# Patient Record
Sex: Female | Born: 1975 | Race: Black or African American | Hispanic: No | Marital: Single | Smoking: Never smoker
Health system: Southern US, Community
[De-identification: ages and names within clinical notes are randomized; demographics above are authoritative.]

## PROBLEM LIST (undated history)

## (undated) ENCOUNTER — Emergency Department (HOSPITAL_COMMUNITY): Disposition: A | Discharge status: Home / Self Care | Payer: Self-pay

## (undated) DIAGNOSIS — D649 Anemia, unspecified: Secondary | ICD-10-CM

## (undated) DIAGNOSIS — R569 Unspecified convulsions: Secondary | ICD-10-CM

## (undated) DIAGNOSIS — K8020 Calculus of gallbladder without cholecystitis without obstruction: Secondary | ICD-10-CM

## (undated) DIAGNOSIS — F32A Depression, unspecified: Secondary | ICD-10-CM

## (undated) DIAGNOSIS — M1990 Unspecified osteoarthritis, unspecified site: Secondary | ICD-10-CM

## (undated) DIAGNOSIS — M797 Fibromyalgia: Secondary | ICD-10-CM

## (undated) DIAGNOSIS — I10 Essential (primary) hypertension: Secondary | ICD-10-CM

## (undated) DIAGNOSIS — F329 Major depressive disorder, single episode, unspecified: Secondary | ICD-10-CM

## (undated) HISTORY — PX: ADENOIDECTOMY: SUR15

## (undated) HISTORY — DX: Anemia, unspecified: D64.9

## (undated) HISTORY — PX: TONSILLECTOMY: SUR1361

## (undated) HISTORY — DX: Major depressive disorder, single episode, unspecified: F32.9

## (undated) HISTORY — PX: TYMPANOSTOMY TUBE PLACEMENT: SHX32

## (undated) HISTORY — PX: ABDOMINOPLASTY: SUR9

## (undated) HISTORY — DX: Essential (primary) hypertension: I10

## (undated) HISTORY — PX: TONSILECTOMY, ADENOIDECTOMY, BILATERAL MYRINGOTOMY AND TUBES: SHX2538

## (undated) HISTORY — DX: Depression, unspecified: F32.A

---
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
## 1998-09-28 ENCOUNTER — Other Ambulatory Visit: Admission: RE | Admit: 1998-09-28 | Discharge: 1998-09-28 | Payer: Self-pay | Admitting: Obstetrics and Gynecology

## 1999-12-05 HISTORY — PX: GASTRIC BYPASS: SHX52

## 2000-10-04 ENCOUNTER — Encounter (INDEPENDENT_AMBULATORY_CARE_PROVIDER_SITE_OTHER): Payer: Self-pay | Admitting: Specialist

## 2000-10-04 ENCOUNTER — Other Ambulatory Visit: Admission: RE | Admit: 2000-10-04 | Discharge: 2000-10-04 | Payer: Self-pay | Admitting: Otolaryngology

## 2001-02-27 ENCOUNTER — Other Ambulatory Visit: Admission: RE | Admit: 2001-02-27 | Discharge: 2001-02-27 | Payer: Self-pay | Admitting: Family Medicine

## 2002-08-28 ENCOUNTER — Other Ambulatory Visit: Admission: RE | Admit: 2002-08-28 | Discharge: 2002-08-28 | Payer: Self-pay | Admitting: Family Medicine

## 2003-10-02 ENCOUNTER — Other Ambulatory Visit: Admission: RE | Admit: 2003-10-02 | Discharge: 2003-10-02 | Payer: Self-pay | Admitting: Obstetrics and Gynecology

## 2008-04-06 ENCOUNTER — Ambulatory Visit (HOSPITAL_COMMUNITY): Admission: RE | Admit: 2008-04-06 | Discharge: 2008-04-06 | Payer: Self-pay | Admitting: Obstetrics and Gynecology

## 2011-03-29 ENCOUNTER — Ambulatory Visit: Payer: Self-pay | Admitting: Internal Medicine

## 2011-08-11 ENCOUNTER — Other Ambulatory Visit (HOSPITAL_COMMUNITY)
Admission: RE | Admit: 2011-08-11 | Discharge: 2011-08-11 | Disposition: A | Discharge status: Home / Self Care | Payer: BC Managed Care – PPO | Source: Ambulatory Visit | Attending: Obstetrics and Gynecology | Admitting: Obstetrics and Gynecology

## 2011-08-11 ENCOUNTER — Other Ambulatory Visit: Payer: Self-pay | Admitting: Obstetrics and Gynecology

## 2011-08-11 DIAGNOSIS — Z1159 Encounter for screening for other viral diseases: Secondary | ICD-10-CM | POA: Insufficient documentation

## 2011-08-11 DIAGNOSIS — Z01419 Encounter for gynecological examination (general) (routine) without abnormal findings: Secondary | ICD-10-CM | POA: Insufficient documentation

## 2012-07-22 ENCOUNTER — Other Ambulatory Visit: Payer: Self-pay | Admitting: Gastroenterology

## 2012-07-22 DIAGNOSIS — R1032 Left lower quadrant pain: Secondary | ICD-10-CM

## 2012-07-25 ENCOUNTER — Ambulatory Visit
Admission: RE | Admit: 2012-07-25 | Discharge: 2012-07-25 | Disposition: A | Discharge status: Home / Self Care | Payer: BC Managed Care – PPO | Source: Ambulatory Visit | Attending: Gastroenterology | Admitting: Gastroenterology

## 2012-07-25 DIAGNOSIS — R1032 Left lower quadrant pain: Secondary | ICD-10-CM

## 2012-07-25 MED ORDER — IOHEXOL 300 MG/ML  SOLN
125.0000 mL | Freq: Once | INTRAMUSCULAR | Status: AC | PRN
  Administered 2012-07-25: 125 mL via INTRAVENOUS

## 2012-09-03 ENCOUNTER — Other Ambulatory Visit (HOSPITAL_COMMUNITY)
Admission: RE | Admit: 2012-09-03 | Discharge: 2012-09-03 | Disposition: A | Discharge status: Home / Self Care | Payer: BC Managed Care – PPO | Source: Ambulatory Visit | Attending: Obstetrics and Gynecology | Admitting: Obstetrics and Gynecology

## 2012-09-03 ENCOUNTER — Other Ambulatory Visit: Payer: Self-pay | Admitting: Obstetrics and Gynecology

## 2012-09-03 DIAGNOSIS — Z113 Encounter for screening for infections with a predominantly sexual mode of transmission: Secondary | ICD-10-CM | POA: Insufficient documentation

## 2012-09-03 DIAGNOSIS — Z1151 Encounter for screening for human papillomavirus (HPV): Secondary | ICD-10-CM | POA: Insufficient documentation

## 2012-09-03 DIAGNOSIS — Z124 Encounter for screening for malignant neoplasm of cervix: Secondary | ICD-10-CM | POA: Insufficient documentation

## 2012-09-12 ENCOUNTER — Encounter (INDEPENDENT_AMBULATORY_CARE_PROVIDER_SITE_OTHER): Payer: Self-pay | Admitting: General Surgery

## 2012-09-19 ENCOUNTER — Ambulatory Visit (INDEPENDENT_AMBULATORY_CARE_PROVIDER_SITE_OTHER): Payer: BC Managed Care – PPO | Admitting: General Surgery

## 2012-09-19 ENCOUNTER — Encounter (INDEPENDENT_AMBULATORY_CARE_PROVIDER_SITE_OTHER): Payer: Self-pay | Admitting: General Surgery

## 2012-09-19 VITALS — BP 138/86 | HR 68 | Temp 98.5°F | Resp 16 | Ht 67.0 in | Wt 314.0 lb

## 2012-09-19 DIAGNOSIS — Z9884 Bariatric surgery status: Secondary | ICD-10-CM

## 2012-09-19 DIAGNOSIS — K8020 Calculus of gallbladder without cholecystitis without obstruction: Secondary | ICD-10-CM

## 2012-09-19 NOTE — Patient Instructions (Signed)
Plan for u/s and upper gi

## 2012-09-27 ENCOUNTER — Ambulatory Visit
Admission: RE | Admit: 2012-09-27 | Discharge: 2012-09-27 | Disposition: A | Discharge status: Home / Self Care | Payer: BC Managed Care – PPO | Source: Ambulatory Visit | Attending: General Surgery | Admitting: General Surgery

## 2012-09-27 DIAGNOSIS — K8020 Calculus of gallbladder without cholecystitis without obstruction: Secondary | ICD-10-CM

## 2012-09-27 DIAGNOSIS — Z9884 Bariatric surgery status: Secondary | ICD-10-CM

## 2012-09-30 ENCOUNTER — Other Ambulatory Visit (INDEPENDENT_AMBULATORY_CARE_PROVIDER_SITE_OTHER): Payer: Self-pay | Admitting: General Surgery

## 2012-10-01 ENCOUNTER — Ambulatory Visit (INDEPENDENT_AMBULATORY_CARE_PROVIDER_SITE_OTHER): Payer: BC Managed Care – PPO | Admitting: General Surgery

## 2012-10-01 ENCOUNTER — Encounter (INDEPENDENT_AMBULATORY_CARE_PROVIDER_SITE_OTHER): Payer: Self-pay | Admitting: General Surgery

## 2012-10-01 VITALS — BP 158/70 | HR 76 | Temp 97.1°F | Resp 16 | Ht 67.0 in | Wt 312.0 lb

## 2012-10-01 DIAGNOSIS — K8020 Calculus of gallbladder without cholecystitis without obstruction: Secondary | ICD-10-CM

## 2012-10-01 NOTE — Progress Notes (Signed)
Subjective:     Patient ID: Crystal Conner, female   DOB: 1976-07-22, 36 y.o.   MRN: [id number]  HPI The patient is a 36 year old white female who has been having some right upper quadrant pain. She has a history of a gastric bypass. Since her last visit we had her undergo an ultrasound which confirmed the presence of gallstones in the gallbladder. We also have her undergo an upper GI that showed no evidence of stricture. She is ready to schedule surgery now.  Review of Systems  Constitutional: Negative.   HENT: Negative.   Eyes: Negative.   Respiratory: Negative.   Cardiovascular: Negative.   Gastrointestinal: Positive for nausea.  Genitourinary: Negative.   Musculoskeletal: Negative.   Skin: Negative.   Neurological: Negative.   Hematological: Negative.   Psychiatric/Behavioral: Negative.        Objective:   Physical Exam  Constitutional: She is oriented to person, place, and time.       Morbidly obese black female  HENT:  Head: Normocephalic and atraumatic.  Eyes: Conjunctivae normal and EOM are normal. Pupils are equal, round, and reactive to light.  Neck: Normal range of motion. Neck supple.  Cardiovascular: Normal rate, regular rhythm and normal heart sounds.   Pulmonary/Chest: Effort normal and breath sounds normal.  Abdominal: Soft. Bowel sounds are normal. There is no tenderness.  Musculoskeletal: Normal range of motion.  Neurological: She is alert and oriented to person, place, and time.  Skin: Skin is warm and dry.  Psychiatric: She has a normal mood and affect. Her behavior is normal.       Assessment:     The patient appears to have symptomatic gallstones. Because of the risks of pancreatitis and further painful episodes of think she would benefit from having her gallbladder removed. I've discussed with her in detail the risks and benefits of the operation to remove the gallbladder as well as some of the technical aspects and she understands and wishes to  proceed.    Plan:     Plan for laparoscopic cholecystectomy with intraoperative cholangiogram

## 2012-10-01 NOTE — Patient Instructions (Signed)
Plan for laparoscopic cholecystectomy with intraoperative cholangiogram 

## 2012-10-16 NOTE — Progress Notes (Signed)
Subjective:     Patient ID: Crystal Conner, female   DOB: 05/09/1976, 36 y.o.   MRN: [id number]  HPI We are asked to see the patient in consultation by Dr. Green to evaluate her for her gallstones. The patient is a 36-year-old black female who over the last month or so has been experiencing some nausea with eating. She is also had a couple episodes of vomiting. She denies much in the way of abdominal pain until just recently when she had an episode of right upper quadrant pain. She does have a history of a gastric bypass done in another town. She also recently had a abdominal ultrasound at her gynecologist office that questioned whether she had 2 gallstones. She also had a CT scan which questioned whether she could have a stricture from her previous gastric bypass. She has not been losing any weight.  Review of Systems  Constitutional: Negative.   HENT: Negative.   Eyes: Negative.   Respiratory: Negative.   Cardiovascular: Negative.   Gastrointestinal: Positive for nausea, vomiting and abdominal pain.  Genitourinary: Negative.   Musculoskeletal: Negative.   Skin: Negative.   Neurological: Negative.   Hematological: Negative.   Psychiatric/Behavioral: Negative.        Objective:   Physical Exam  Constitutional: She is oriented to person, place, and time. She appears well-developed and well-nourished.  HENT:  Head: Normocephalic and atraumatic.  Eyes: Conjunctivae normal and EOM are normal. Pupils are equal, round, and reactive to light.  Neck: Normal range of motion. Neck supple.  Cardiovascular: Normal rate, regular rhythm and normal heart sounds.   Pulmonary/Chest: Effort normal and breath sounds normal.  Abdominal: Soft. Bowel sounds are normal. She exhibits no mass. There is no tenderness.  Musculoskeletal: Normal range of motion.  Neurological: She is alert and oriented to person, place, and time.  Skin: Skin is warm and dry.  Psychiatric: She has a normal mood and affect.  Her behavior is normal.       Assessment:     The patient has a symptoms that are consistent with biliary colic. We will repeat her abdominal ultrasound so that our radiologist can confirm that she has gallstones. If she does not think she would be a candidate for a laparoscopic cholecystectomy. I have discussed with her in detail the risks and benefits of the operation to remove the gallbladder as well as some of the technical aspects and she understands and wishes to proceed. This can be complicated by the fact that she's had a previous gastric bypass. We will also obtain an upper GI with small bowel follow-through to make sure that she has no stricture associated with her previous surgery    Plan:     Plan for laparoscopic cholecystectomy with intraoperative cholangiogram if her studies show what we expect him to show      

## 2012-10-22 ENCOUNTER — Encounter (HOSPITAL_COMMUNITY): Payer: Self-pay | Admitting: Pharmacy Technician

## 2012-10-23 ENCOUNTER — Encounter (HOSPITAL_COMMUNITY)
Admission: RE | Admit: 2012-10-23 | Discharge: 2012-10-23 | Disposition: A | Discharge status: Home / Self Care | Payer: BC Managed Care – PPO | Source: Ambulatory Visit | Attending: General Surgery | Admitting: General Surgery

## 2012-10-23 ENCOUNTER — Encounter (HOSPITAL_COMMUNITY): Payer: Self-pay

## 2012-10-23 ENCOUNTER — Ambulatory Visit (HOSPITAL_COMMUNITY)
Admission: RE | Admit: 2012-10-23 | Discharge: 2012-10-23 | Disposition: A | Discharge status: Home / Self Care | Payer: BC Managed Care – PPO | Source: Ambulatory Visit | Attending: General Surgery | Admitting: General Surgery

## 2012-10-23 DIAGNOSIS — Z01812 Encounter for preprocedural laboratory examination: Secondary | ICD-10-CM | POA: Insufficient documentation

## 2012-10-23 DIAGNOSIS — J8410 Pulmonary fibrosis, unspecified: Secondary | ICD-10-CM | POA: Insufficient documentation

## 2012-10-23 DIAGNOSIS — K8020 Calculus of gallbladder without cholecystitis without obstruction: Secondary | ICD-10-CM | POA: Insufficient documentation

## 2012-10-23 DIAGNOSIS — I10 Essential (primary) hypertension: Secondary | ICD-10-CM | POA: Insufficient documentation

## 2012-10-23 DIAGNOSIS — M4120 Other idiopathic scoliosis, site unspecified: Secondary | ICD-10-CM | POA: Insufficient documentation

## 2012-10-23 HISTORY — DX: Unspecified osteoarthritis, unspecified site: M19.90

## 2012-10-23 HISTORY — DX: Calculus of gallbladder without cholecystitis without obstruction: K80.20

## 2012-10-23 LAB — CBC
Hemoglobin: 11.6 g/dL — ABNORMAL LOW (ref 12.0–15.0)
MCH: 28.6 pg (ref 26.0–34.0)
MCHC: 34 g/dL (ref 30.0–36.0)

## 2012-10-23 LAB — BASIC METABOLIC PANEL
BUN: 12 mg/dL (ref 6–23)
Calcium: 9.5 mg/dL (ref 8.4–10.5)
GFR calc non Af Amer: 90 mL/min — ABNORMAL LOW (ref >90)
Glucose, Bld: 84 mg/dL (ref 70–99)
Sodium: 140 mEq/L (ref 135–145)

## 2012-10-23 LAB — SURGICAL PCR SCREEN: MRSA, PCR: NEGATIVE

## 2012-10-23 NOTE — Patient Instructions (Signed)
Crystal Conner  10/23/2012                           YOUR PROCEDURE IS SCHEDULED ON:  10/30/12 AT 10:30 AM               PLEASE REPORT TO SHORT STAY CENTER AT :  8:00 AM               CALL THIS NUMBER IF ANY PROBLEMS THE DAY OF SURGERY :               [phone]                      REMEMBER:   Do not eat food or drink liquids AFTER MIDNIGHT    Take these medicines the morning of surgery with A SIP OF WATER:  ZYRTEC / BIRTH CONTROL PILL / PROVERA   Do not wear jewelry, make-up   Do not wear lotions, powders, or perfumes.   Do not shave legs or underarms 12 hrs. before surgery (men may shave face)  Do not bring valuables to the hospital.  Contacts, dentures or bridgework may not be worn into surgery.  Leave suitcase in the car. After surgery it may be brought to your room.  For patients admitted to the hospital more than one night, checkout time is 11:00                          The day of discharge.   Patients discharged the day of surgery will not be allowed to drive home                             If going home same day of surgery, must have someone stay with you first                           24 hrs at home and arrange for some one to drive you home from hospital.    Special Instructions:   Please read over the following fact sheets that you were given:               1. MRSA  INFORMATION                      2. [place] PREPARING FOR SURGERY SHEET                                                X_____________________________________________________________________

## 2012-10-30 ENCOUNTER — Encounter (HOSPITAL_COMMUNITY)
Admission: RE | Disposition: A | Discharge status: Home / Self Care | Payer: Self-pay | Source: Ambulatory Visit | Attending: General Surgery

## 2012-10-30 ENCOUNTER — Ambulatory Visit (HOSPITAL_COMMUNITY): Payer: BC Managed Care – PPO

## 2012-10-30 ENCOUNTER — Encounter (HOSPITAL_COMMUNITY): Payer: Self-pay | Admitting: *Deleted

## 2012-10-30 ENCOUNTER — Encounter (HOSPITAL_COMMUNITY): Payer: Self-pay | Admitting: Anesthesiology

## 2012-10-30 ENCOUNTER — Ambulatory Visit (HOSPITAL_COMMUNITY): Payer: BC Managed Care – PPO | Admitting: Anesthesiology

## 2012-10-30 ENCOUNTER — Ambulatory Visit (HOSPITAL_COMMUNITY)
Admission: RE | Admit: 2012-10-30 | Discharge: 2012-10-30 | Disposition: A | Discharge status: Home / Self Care | Payer: BC Managed Care – PPO | Source: Ambulatory Visit | Attending: General Surgery | Admitting: General Surgery

## 2012-10-30 DIAGNOSIS — Z9884 Bariatric surgery status: Secondary | ICD-10-CM | POA: Insufficient documentation

## 2012-10-30 DIAGNOSIS — K8010 Calculus of gallbladder with chronic cholecystitis without obstruction: Secondary | ICD-10-CM | POA: Insufficient documentation

## 2012-10-30 DIAGNOSIS — I10 Essential (primary) hypertension: Secondary | ICD-10-CM | POA: Insufficient documentation

## 2012-10-30 DIAGNOSIS — K8020 Calculus of gallbladder without cholecystitis without obstruction: Secondary | ICD-10-CM

## 2012-10-30 HISTORY — PX: CHOLECYSTECTOMY: SHX55

## 2012-10-30 SURGERY — LAPAROSCOPIC CHOLECYSTECTOMY WITH INTRAOPERATIVE CHOLANGIOGRAM
Anesthesia: General | Site: Abdomen | Wound class: Clean Contaminated

## 2012-10-30 MED ORDER — NEOSTIGMINE METHYLSULFATE 1 MG/ML IJ SOLN
INTRAMUSCULAR | Status: DC | PRN
  Administered 2012-10-30: 5 mg via INTRAVENOUS

## 2012-10-30 MED ORDER — CIPROFLOXACIN IN D5W 400 MG/200ML IV SOLN
INTRAVENOUS | Status: AC
  Filled 2012-10-30: qty 200

## 2012-10-30 MED ORDER — LACTATED RINGERS IV SOLN
INTRAVENOUS | Status: DC | PRN
  Administered 2012-10-30: 1000 mL via INTRAVENOUS

## 2012-10-30 MED ORDER — PROMETHAZINE HCL 25 MG/ML IJ SOLN
6.2500 mg | INTRAMUSCULAR | Status: DC | PRN
  Administered 2012-10-30: 6.25 mg via INTRAVENOUS
  Filled 2012-10-30: qty 1

## 2012-10-30 MED ORDER — HYDROMORPHONE HCL PF 1 MG/ML IJ SOLN
INTRAMUSCULAR | Status: DC | PRN
  Administered 2012-10-30: 0.5 mg via INTRAVENOUS
  Administered 2012-10-30 (×2): 1 mg via INTRAVENOUS

## 2012-10-30 MED ORDER — ROCURONIUM BROMIDE 100 MG/10ML IV SOLN
INTRAVENOUS | Status: DC | PRN
  Administered 2012-10-30: 10 mg via INTRAVENOUS
  Administered 2012-10-30: 30 mg via INTRAVENOUS

## 2012-10-30 MED ORDER — IOHEXOL 300 MG/ML  SOLN
INTRAMUSCULAR | Status: AC
  Filled 2012-10-30: qty 1

## 2012-10-30 MED ORDER — CHLORHEXIDINE GLUCONATE 4 % EX LIQD
1.0000 "application " | Freq: Once | CUTANEOUS | Status: DC
  Filled 2012-10-30: qty 15

## 2012-10-30 MED ORDER — LACTATED RINGERS IV SOLN: INTRAVENOUS | Status: DC

## 2012-10-30 MED ORDER — BUPIVACAINE-EPINEPHRINE 0.25% -1:200000 IJ SOLN
INTRAMUSCULAR | Status: DC | PRN
  Administered 2012-10-30: 17 mL

## 2012-10-30 MED ORDER — CIPROFLOXACIN IN D5W 400 MG/200ML IV SOLN
400.0000 mg | INTRAVENOUS | Status: AC
  Administered 2012-10-30: 400 mg via INTRAVENOUS

## 2012-10-30 MED ORDER — MIDAZOLAM HCL 5 MG/5ML IJ SOLN
INTRAMUSCULAR | Status: DC | PRN
  Administered 2012-10-30: 2 mg via INTRAVENOUS

## 2012-10-30 MED ORDER — LACTATED RINGERS IV SOLN
INTRAVENOUS | Status: DC | PRN
  Administered 2012-10-30 (×2): via INTRAVENOUS

## 2012-10-30 MED ORDER — AMLODIPINE BESYLATE 5 MG PO TABS
5.0000 mg | ORAL_TABLET | Freq: Once | ORAL | Status: AC
  Administered 2012-10-30: 5 mg via ORAL
  Filled 2012-10-30: qty 1

## 2012-10-30 MED ORDER — ONDANSETRON HCL 4 MG/2ML IJ SOLN
INTRAMUSCULAR | Status: DC | PRN
  Administered 2012-10-30: 4 mg via INTRAVENOUS

## 2012-10-30 MED ORDER — HYDROCODONE-ACETAMINOPHEN 5-325 MG PO TABS: 1.0000 | ORAL_TABLET | Freq: Four times a day (QID) | ORAL | Status: DC | PRN

## 2012-10-30 MED ORDER — PROPOFOL 10 MG/ML IV BOLUS
INTRAVENOUS | Status: DC | PRN
  Administered 2012-10-30: 200 mg via INTRAVENOUS

## 2012-10-30 MED ORDER — IOHEXOL 300 MG/ML  SOLN
INTRAMUSCULAR | Status: DC | PRN
  Administered 2012-10-30: 17 mL

## 2012-10-30 MED ORDER — SUCCINYLCHOLINE CHLORIDE 20 MG/ML IJ SOLN
INTRAMUSCULAR | Status: DC | PRN
  Administered 2012-10-30: 100 mg via INTRAVENOUS

## 2012-10-30 MED ORDER — LIDOCAINE HCL (CARDIAC) 20 MG/ML IV SOLN
INTRAVENOUS | Status: DC | PRN
  Administered 2012-10-30: 50 mg via INTRAVENOUS

## 2012-10-30 MED ORDER — BUPIVACAINE-EPINEPHRINE 0.25% -1:200000 IJ SOLN
INTRAMUSCULAR | Status: AC
  Filled 2012-10-30: qty 1

## 2012-10-30 MED ORDER — GLYCOPYRROLATE 0.2 MG/ML IJ SOLN
INTRAMUSCULAR | Status: DC | PRN
  Administered 2012-10-30: .8 mg via INTRAVENOUS

## 2012-10-30 MED ORDER — HYDROMORPHONE HCL PF 1 MG/ML IJ SOLN: 0.2500 mg | INTRAMUSCULAR | Status: DC | PRN

## 2012-10-30 MED ORDER — DEXAMETHASONE SODIUM PHOSPHATE 10 MG/ML IJ SOLN
INTRAMUSCULAR | Status: DC | PRN
  Administered 2012-10-30: 10 mg via INTRAVENOUS

## 2012-10-30 MED ORDER — FENTANYL CITRATE 0.05 MG/ML IJ SOLN
INTRAMUSCULAR | Status: DC | PRN
  Administered 2012-10-30: 100 ug via INTRAVENOUS
  Administered 2012-10-30: 50 ug via INTRAVENOUS
  Administered 2012-10-30: 100 ug via INTRAVENOUS

## 2012-10-30 SURGICAL SUPPLY — 38 items
ADH SKN CLS APL DERMABOND .7 (GAUZE/BANDAGES/DRESSINGS) ×1
APPLIER CLIP ROT 10 11.4 M/L (STAPLE) ×2
APR CLP MED LRG 11.4X10 (STAPLE) ×1
BAG SPEC RTRVL LRG 6X4 10 (ENDOMECHANICALS)
CANISTER SUCTION 2500CC (MISCELLANEOUS) ×2 IMPLANT
CATH REDDICK CHOLANGI 4FR 50CM (CATHETERS) ×2 IMPLANT
CLIP APPLIE ROT 10 11.4 M/L (STAPLE) ×1 IMPLANT
CLOTH BEACON ORANGE TIMEOUT ST (SAFETY) ×2 IMPLANT
COVER MAYO STAND STRL (DRAPES) ×2 IMPLANT
DECANTER SPIKE VIAL GLASS SM (MISCELLANEOUS) ×2 IMPLANT
DERMABOND ADVANCED (GAUZE/BANDAGES/DRESSINGS) ×1
DERMABOND ADVANCED .7 DNX12 (GAUZE/BANDAGES/DRESSINGS) ×1 IMPLANT
DRAPE C-ARM 42X72 X-RAY (DRAPES) ×2 IMPLANT
DRAPE LAPAROSCOPIC ABDOMINAL (DRAPES) ×2 IMPLANT
DRAPE UTILITY XL STRL (DRAPES) ×2 IMPLANT
ELECT REM PT RETURN 9FT ADLT (ELECTROSURGICAL) ×2
ELECTRODE REM PT RTRN 9FT ADLT (ELECTROSURGICAL) ×1 IMPLANT
GLOVE BIO SURGEON STRL SZ7.5 (GLOVE) ×4 IMPLANT
GLOVE BIOGEL PI IND STRL 7.0 (GLOVE) ×1 IMPLANT
GLOVE BIOGEL PI INDICATOR 7.0 (GLOVE) ×1
GOWN PREVENTION PLUS XLARGE (GOWN DISPOSABLE) ×2 IMPLANT
GOWN STRL NON-REIN LRG LVL3 (GOWN DISPOSABLE) ×2 IMPLANT
GOWN STRL REIN XL XLG (GOWN DISPOSABLE) ×2 IMPLANT
HEMOSTAT SURGICEL 4X8 (HEMOSTASIS) ×2 IMPLANT
HOVERMATT SINGLE USE (MISCELLANEOUS) ×1 IMPLANT
IV CATH 14GX2 1/4 (CATHETERS) ×2 IMPLANT
KIT BASIN OR (CUSTOM PROCEDURE TRAY) ×2 IMPLANT
POUCH SPECIMEN RETRIEVAL 10MM (ENDOMECHANICALS) IMPLANT
SET IRRIG TUBING LAPAROSCOPIC (IRRIGATION / IRRIGATOR) ×2 IMPLANT
SOLUTION ANTI FOG 6CC (MISCELLANEOUS) ×2 IMPLANT
SUT MNCRL AB 4-0 PS2 18 (SUTURE) ×2 IMPLANT
TOWEL OR 17X26 10 PK STRL BLUE (TOWEL DISPOSABLE) ×6 IMPLANT
TRAY LAP CHOLE (CUSTOM PROCEDURE TRAY) ×2 IMPLANT
TROCAR BLADELESS OPT 5 75 (ENDOMECHANICALS) ×4 IMPLANT
TROCAR XCEL BLUNT TIP 100MML (ENDOMECHANICALS) ×2 IMPLANT
TROCAR XCEL NON-BLD 11X100MML (ENDOMECHANICALS) ×2 IMPLANT
TROCAR Z-THREAD FIOS 5X100MM (TROCAR) ×1 IMPLANT
TUBING INSUFFLATION 10FT LAP (TUBING) ×2 IMPLANT

## 2012-10-30 NOTE — Anesthesia Preprocedure Evaluation (Signed)
Anesthesia Evaluation  Patient identified by MRN, date of birth, ID band Patient awake    Reviewed: Allergy & Precautions, H&P , NPO status , Patient's Chart, lab work & pertinent test results, reviewed documented beta blocker date and time   Airway Mallampati: II TM Distance: >3 FB Neck ROM: full    Dental No notable dental hx.    Pulmonary neg pulmonary ROS,  breath sounds clear to auscultation  Pulmonary exam normal       Cardiovascular Exercise Tolerance: Good hypertension, On Medications Rhythm:regular Rate:Normal     Neuro/Psych negative neurological ROS  negative psych ROS   GI/Hepatic negative GI ROS, Neg liver ROS,   Endo/Other  Morbid obesity  Renal/GU negative Renal ROS  negative genitourinary   Musculoskeletal   Abdominal   Peds  Hematology negative hematology ROS (+)   Anesthesia Other Findings   Reproductive/Obstetrics Negative pregnancy test.                           Anesthesia Physical Anesthesia Plan  ASA: III  Anesthesia Plan: General ETT   Post-op Pain Management:    Induction:   Airway Management Planned:   Additional Equipment:   Intra-op Plan:   Post-operative Plan:   Informed Consent: I have reviewed the patients History and Physical, chart, labs and discussed the procedure including the risks, benefits and alternatives for the proposed anesthesia with the patient or authorized representative who has indicated his/her understanding and acceptance.   Dental Advisory Given  Plan Discussed with: CRNA  Anesthesia Plan Comments:         Anesthesia Quick Evaluation

## 2012-10-30 NOTE — Op Note (Signed)
10/30/2012  11:22 AM  PATIENT:  Crystal Conner  36 y.o. female  PRE-OPERATIVE DIAGNOSIS:  gallstones  POST-OPERATIVE DIAGNOSIS:  gallstones  PROCEDURE:  Procedure(s) (LRB) with comments: LAPAROSCOPIC CHOLECYSTECTOMY WITH INTRAOPERATIVE CHOLANGIOGRAM (N/A)  SURGEON:  Surgeon(s) and Role:    * Robyne Askew, MD - Primary    * Valarie Merino, MD - Assisting  PHYSICIAN ASSISTANT:   ASSISTANTS: none   ANESTHESIA:   general  EBL:  Total I/O In: 1000 [I.V.:1000] Out: -   BLOOD ADMINISTERED:none  DRAINS: none   LOCAL MEDICATIONS USED:  MARCAINE     SPECIMEN:  Source of Specimen:  gallbladder  DISPOSITION OF SPECIMEN:  PATHOLOGY  COUNTS:  YES  TOURNIQUET:  * No tourniquets in log *  DICTATION: .Dragon Dictation @opnoteheader @  Procedure: After informed consent was obtained the patient was brought to the operating room and placed in the supine position on the operating room table. After adequate induction of general anesthesia the patient's abdomen was prepped with ChloraPrep allowed to dry and draped in usual sterile manner.An area in the right upper quadrant was infiltrated with 0.25%marcaine. A small incision was made with a 15 blade knife. A 5 mm Optiview port with the camera was used to dissect bluntly through the layers of the abdominal wall until access was gained to the abdominal cavity. The abdomen was then insufflated with carbon dioxide without difficulty.. Next the epigastric region was infiltrated with % Marcaine. A small incision was made with a 15 blade knife. A 10 mm port was placed bluntly through this incision into the abdominal cavity under direct vision.another site was chosen laterally on the right side of the abdomen for placement of a 5 mm port. A small area area was infiltrated with quarter percent Marcaine. A Small stab incision was made with a 15 blade knife. 5 mm port was then placed bluntly through this incision into the abdominal cavity under  direct vision without difficulty. Another site was chosen just lateral to the umbilicus for placement of another 5 mm port. This area was infiltrated with quarter percent Marcaine. A small stab incision was made with a 15 blade knife. A 5 mm port was placed bluntly through this incision into the abdominal cavity under direct vision. The camera was then moved to this port. A blunt grasper was placed through the lateralmost 5 mm port and used to grasp the dome of the gallbladder and elevated anteriorly and superiorly. Another blunt grasper was placed through the other 5 mm port and used to retract the body and neck of the gallbladder. A dissector was placed through the epigastric port and using the electrocautery the peritoneal reflection at the gallbladder neck was opened. Blunt dissection was then carried out in this area until the gallbladder neck-cystic duct junction was readily identified and a good window was created. A single clip was placed on the gallbladder neck. A small  ductotomy was made just below the clip with laparoscopic scissors. A 14-gauge Angiocath was then placed through the anterior abdominal wall under direct vision. A Reddick cholangiogram catheter was then placed through the Angiocath and flushed. The catheter was then placed in the cystic duct and anchored in place with a clip. A cholangiogram was obtained that showed no filling defects good emptying into the duodenum an adequate length on the cystic duct. The anchoring clip and catheters were then removed from the patient. 3 clips were placed proximally on the cystic duct and the duct was divided between  the 2 sets of clips. Posterior to this the cystic artery was identified and again dissected bluntly in a circumferential manner until a good window  was created. 2 clips were placed proximally and one distally on the artery and the artery was divided between the 2 sets of clips. Next a laparoscopic hook cautery device was used to separate  the gallbladder from the liver bed. Prior to completely detaching the gallbladder from the liver bed the liver bed was inspected and several small bleeding points were coagulated with the electrocautery until the area was completely hemostatic. The gallbladder was then detached the rest of it from the liver bed without difficulty. A laparoscopic bag was inserted through the epigastric port. The gallbladder was placed within the bag and the bag was sealed.the gallbladder and bag were removed through the epigastric port without difficulty.The liver bed was inspected again and found to be hemostatic. The abdomen was irrigated with copious amounts of saline until the effluent was clear. The ports were then removed under direct vision without difficulty and were found to be hemostatic. The gas was allowed to escape. The skin incisions were all closed with interrupted 4-0 Monocryl subcuticular stitches. Dermabond dressings were applied. The patient tolerated the procedure well. At the end of the case all needle sponge and instrument counts were correct. The patient was then awakened and taken to recovery in stable condition   PLAN OF CARE: Discharge to home after PACU  PATIENT DISPOSITION:  PACU - hemodynamically stable.   Delay start of Pharmacological VTE agent (>24hrs) due to surgical blood loss or risk of bleeding: no

## 2012-10-30 NOTE — Progress Notes (Signed)
Patient very sleepy hardly able to open eyes. IV restarted and Phenergan given for N/V and sleeping at short intervals.

## 2012-10-30 NOTE — Progress Notes (Signed)
Patient is more alert after gallbladder surgery. Up to bathroom and tolerated well. Nausea is improved.

## 2012-10-30 NOTE — Interval H&P Note (Signed)
History and Physical Interval Note:  10/30/2012 9:06 AM  Crystal Conner  has presented today for surgery, with the diagnosis of gallstones  The various methods of treatment have been discussed with the patient and family. After consideration of risks, benefits and other options for treatment, the patient has consented to  Procedure(s) (LRB) with comments: LAPAROSCOPIC CHOLECYSTECTOMY WITH INTRAOPERATIVE CHOLANGIOGRAM (N/A) as a surgical intervention .  The patient's history has been reviewed, patient examined, no change in status, stable for surgery.  I have reviewed the patient's chart and labs.  Questions were answered to the patient's satisfaction.     TOTH III,PAUL S

## 2012-10-30 NOTE — Transfer of Care (Signed)
Immediate Anesthesia Transfer of Care Note  Patient: Crystal Conner  Procedure(s) Performed: Procedure(s) (LRB): LAPAROSCOPIC CHOLECYSTECTOMY WITH INTRAOPERATIVE CHOLANGIOGRAM (N/A)  Patient Location: PACU  Anesthesia Type: General  Level of Consciousness: sedated, patient cooperative and responds to stimulaton  Airway & Oxygen Therapy: Patient Spontanous Breathing and Patient connected to face mask oxgen  Post-op Assessment: Report given to PACU RN and Post -op Vital signs reviewed and stable  Post vital signs: Reviewed and stable  Complications: No apparent anesthesia complications\

## 2012-10-30 NOTE — Anesthesia Postprocedure Evaluation (Signed)
  Anesthesia Post-op Note  Patient: Crystal Conner  Procedure(s) Performed: Procedure(s) (LRB): LAPAROSCOPIC CHOLECYSTECTOMY WITH INTRAOPERATIVE CHOLANGIOGRAM (N/A)  Patient Location: PACU  Anesthesia Type: General  Level of Consciousness: awake and alert   Airway and Oxygen Therapy: Patient Spontanous Breathing  Post-op Pain: mild  Post-op Assessment: Post-op Vital signs reviewed, Patient's Cardiovascular Status Stable, Respiratory Function Stable, Patent Airway and No signs of Nausea or vomiting  Last Vitals:  Filed Vitals:   10/30/12 1215  BP: 162/76  Pulse: 70  Temp: 36.8 C  Resp: 13    Post-op Vital Signs: stable   Complications: No apparent anesthesia complications

## 2012-10-30 NOTE — H&P (View-Only) (Signed)
Subjective:     Patient ID: Crystal Conner, female   DOB: 07/11/1976, 36 y.o.   MRN: [id number]  HPI We are asked to see the patient in consultation by Dr. Chilton Si to evaluate her for her gallstones. The patient is a 36 year old black female who over the last month or so has been experiencing some nausea with eating. She is also had a couple episodes of vomiting. She denies much in the way of abdominal pain until just recently when she had an episode of right upper quadrant pain. She does have a history of a gastric bypass done in another town. She also recently had a abdominal ultrasound at her gynecologist office that questioned whether she had 2 gallstones. She also had a CT scan which questioned whether she could have a stricture from her previous gastric bypass. She has not been losing any weight.  Review of Systems  Constitutional: Negative.   HENT: Negative.   Eyes: Negative.   Respiratory: Negative.   Cardiovascular: Negative.   Gastrointestinal: Positive for nausea, vomiting and abdominal pain.  Genitourinary: Negative.   Musculoskeletal: Negative.   Skin: Negative.   Neurological: Negative.   Hematological: Negative.   Psychiatric/Behavioral: Negative.        Objective:   Physical Exam  Constitutional: She is oriented to person, place, and time. She appears well-developed and well-nourished.  HENT:  Head: Normocephalic and atraumatic.  Eyes: Conjunctivae normal and EOM are normal. Pupils are equal, round, and reactive to light.  Neck: Normal range of motion. Neck supple.  Cardiovascular: Normal rate, regular rhythm and normal heart sounds.   Pulmonary/Chest: Effort normal and breath sounds normal.  Abdominal: Soft. Bowel sounds are normal. She exhibits no mass. There is no tenderness.  Musculoskeletal: Normal range of motion.  Neurological: She is alert and oriented to person, place, and time.  Skin: Skin is warm and dry.  Psychiatric: She has a normal mood and affect.  Her behavior is normal.       Assessment:     The patient has a symptoms that are consistent with biliary colic. We will repeat her abdominal ultrasound so that our radiologist can confirm that she has gallstones. If she does not think she would be a candidate for a laparoscopic cholecystectomy. I have discussed with her in detail the risks and benefits of the operation to remove the gallbladder as well as some of the technical aspects and she understands and wishes to proceed. This can be complicated by the fact that she's had a previous gastric bypass. We will also obtain an upper GI with small bowel follow-through to make sure that she has no stricture associated with her previous surgery    Plan:     Plan for laparoscopic cholecystectomy with intraoperative cholangiogram if her studies show what we expect him to show

## 2012-11-19 ENCOUNTER — Ambulatory Visit (INDEPENDENT_AMBULATORY_CARE_PROVIDER_SITE_OTHER): Payer: BC Managed Care – PPO | Admitting: General Surgery

## 2012-11-19 ENCOUNTER — Encounter (INDEPENDENT_AMBULATORY_CARE_PROVIDER_SITE_OTHER): Payer: Self-pay | Admitting: General Surgery

## 2012-11-19 ENCOUNTER — Encounter (INDEPENDENT_AMBULATORY_CARE_PROVIDER_SITE_OTHER): Payer: Self-pay

## 2012-11-19 VITALS — BP 130/78 | HR 80 | Temp 97.3°F | Resp 16 | Ht 67.0 in | Wt 313.8 lb

## 2012-11-19 DIAGNOSIS — K8020 Calculus of gallbladder without cholecystitis without obstruction: Secondary | ICD-10-CM

## 2012-11-19 NOTE — Patient Instructions (Signed)
No heavy lifting for a couple more weeks

## 2012-11-19 NOTE — Progress Notes (Signed)
Subjective:     Patient ID: Crystal Conner, female   DOB: [birth year], 36 y.o.   MRN: [id number]  HPI The patient is a 36 year old black female who is about 2 weeks status post laparoscopic cholecystectomy for cholecystitis and cholelithiasis. She continues to have some right upper quadrant soreness. She denies any fevers or chills. Her appetite is good and her bowels are working normally.  Review of Systems     Objective:   Physical Exam On exam her abdomen is soft with mild tenderness in the right upper quadrant. Her incisions are healing nicely with no sign of infection.    Assessment:     2 weeks status post laparoscopic cholecystectomy    Plan:     At this point would like her to refrain from lifting heavy stuff for another couple weeks. She may return to work. We will plan to see her back in one month to make sure the soreness resolves.

## 2012-11-20 ENCOUNTER — Telehealth (INDEPENDENT_AMBULATORY_CARE_PROVIDER_SITE_OTHER): Payer: Self-pay | Admitting: General Surgery

## 2012-11-20 NOTE — Telephone Encounter (Signed)
Faxed return to work form to employer on 11/20/12.

## 2012-12-26 ENCOUNTER — Encounter (INDEPENDENT_AMBULATORY_CARE_PROVIDER_SITE_OTHER): Payer: BC Managed Care – PPO | Admitting: General Surgery

## 2014-03-06 ENCOUNTER — Other Ambulatory Visit (HOSPITAL_BASED_OUTPATIENT_CLINIC_OR_DEPARTMENT_OTHER): Payer: Self-pay | Admitting: Internal Medicine

## 2014-03-06 ENCOUNTER — Ambulatory Visit (HOSPITAL_BASED_OUTPATIENT_CLINIC_OR_DEPARTMENT_OTHER)
Admission: RE | Admit: 2014-03-06 | Discharge: 2014-03-06 | Disposition: A | Discharge status: Home / Self Care | Payer: BC Managed Care – PPO | Source: Ambulatory Visit | Attending: Internal Medicine | Admitting: Internal Medicine

## 2014-03-06 DIAGNOSIS — M549 Dorsalgia, unspecified: Secondary | ICD-10-CM

## 2014-03-06 DIAGNOSIS — R109 Unspecified abdominal pain: Secondary | ICD-10-CM

## 2014-03-06 DIAGNOSIS — K429 Umbilical hernia without obstruction or gangrene: Secondary | ICD-10-CM | POA: Insufficient documentation

## 2014-03-06 DIAGNOSIS — R319 Hematuria, unspecified: Secondary | ICD-10-CM

## 2014-10-13 ENCOUNTER — Other Ambulatory Visit: Payer: Self-pay | Admitting: Obstetrics and Gynecology

## 2017-07-23 ENCOUNTER — Telehealth: Payer: Self-pay | Admitting: Neurology

## 2017-07-23 ENCOUNTER — Encounter: Payer: Self-pay | Admitting: Neurology

## 2017-07-23 ENCOUNTER — Ambulatory Visit (INDEPENDENT_AMBULATORY_CARE_PROVIDER_SITE_OTHER): Payer: Managed Care, Other (non HMO) | Admitting: Neurology

## 2017-07-23 ENCOUNTER — Encounter (INDEPENDENT_AMBULATORY_CARE_PROVIDER_SITE_OTHER): Payer: Self-pay

## 2017-07-23 DIAGNOSIS — R410 Disorientation, unspecified: Secondary | ICD-10-CM | POA: Diagnosis not present

## 2017-07-23 MED ORDER — LAMOTRIGINE 25 MG PO TABS: 25.0000 mg | ORAL_TABLET | Freq: Every day | ORAL | 0 refills | Status: DC

## 2017-07-23 MED ORDER — LAMOTRIGINE 100 MG PO TABS: 100.0000 mg | ORAL_TABLET | Freq: Two times a day (BID) | ORAL | 4 refills | Status: DC

## 2017-07-23 NOTE — Telephone Encounter (Signed)
Pt calling to inform that the amoTRIgine (LAMICTAL) 25 MG tablet is making her very drowsy and she wants to know if she needs to take a leave from work until her body gets used to it or if there is another suggestion.  Pt states she will not be able to work with this medication in her system, please call

## 2017-07-23 NOTE — Telephone Encounter (Signed)
Left message requesting a return call.

## 2017-07-23 NOTE — Progress Notes (Signed)
PATIENT: Crystal Conner DOB: 08/25/1976  Chief Complaint  Patient presents with  . Staring/Blackout Spells    She is here with her mother, who is from [place].  Reports having five episodes of staring and loss of consciousness over the last two months.  Episodes last 5 or less minutes.  She returns back to baseline shortly after the event but feels fatigue the remainder of the day.  [name] PCP    Helane Rima, MD     HISTORICAL  Crystal Conner is a 41 years old right-handed female, seen in refer by her primary care physician Dr. Lavone Neri, [place] for evaluation of recurrent spells of loss of consciousness, she is accompanied by her mother at today's clinical visit.  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   first episode was in June 2018, she was at a nail shop, soaking her nails, without warning signs, she had transient loss of consciousness, she was awake, she was attended by the technician, she thought she was just doze off.  There was multiple episode her coworker reminded her that she has transient blank stares, unresponsive lasting for a few seconds, she felt like she did not hear anything over those few seconds, no seizure activity noted.  Similar episode was also noted by her relatives when they spent few days together on a cruise trip,  She has mild bilateral pressure headache,   RIVIEW OF SYSTEMS: Full 14 system review of systems performed and notable only for as above ALLERGIES: Allergies  Allergen Reactions  . Penicillins Hives    HOME MEDICATIONS: Current Outpatient Prescriptions  Medication Sig Dispense Refill  . cetirizine (ZYRTEC) 10 MG tablet Take 10  mg by mouth daily.    [name] desvenlafaxine (PRISTIQ) 50 MG 24 hr tablet Take 50 mg by mouth daily.    [name] EXFORGE HCT 5-160-12.5 MG TABS Take 1 tablet by mouth daily before breakfast.     . ferrous sulfate 325 (65 FE) MG tablet Take 325 mg by mouth daily with breakfast.    . FLUoxetine (PROZAC) 10 MG capsule Take 10 mg by mouth daily.    [name] Triphasic (TRI-PREVIFEM PO) Take 1 tablet by mouth daily.     No current facility-administered medications for this visit.     PAST MEDICAL HISTORY: Past Medical History:  Diagnosis Date  . Anemia   . Arthritis   . Depression   . Gallstones   . Hypertension     PAST SURGICAL HISTORY: Past Surgical History:  Procedure Laterality Date  . ABDOMINOPLASTY    . CHOLECYSTECTOMY  10/30/2012   Procedure: LAPAROSCOPIC CHOLECYSTECTOMY WITH INTRAOPERATIVE CHOLANGIOGRAM;  Surgeon: Merrie Roof, MD;  Location: [facility];  Service: General;  Laterality: N/A;  . GASTRIC BYPASS  2001  . KNEE SURGERY  1993   left knee  . TONSILECTOMY, ADENOIDECTOMY, BILATERAL MYRINGOTOMY AND TUBES      FAMILY HISTORY: Family History  Problem Relation Age of Onset  . Cancer Father        prostate  . Cancer Maternal Grandfather        breast    SOCIAL HISTORY:  Social History   Social History  .  Marital status: Single    Spouse name: N/A  . Number of children: 0  . Years of education: Bachelors   Occupational History  . Customer Service Supervisor    Social History Main Topics  . Smoking status: Never Smoker  . Smokeless tobacco: Never Used  . Alcohol use No  . Drug use: No  . Sexual activity: Not on file   Other Topics Concern  . Not on file   Social History Narrative   Lives at home alone.   Right-handed.   2-3 cups caffeine per day.     PHYSICAL EXAM   Vitals:   07/23/17 0836  BP: 123/82  Pulse: 74  Weight: (!) 321 lb (145.6 kg)  Height: 5\' 5"  (1.651 m)    Not recorded      Body mass index is 53.42 kg/m.  PHYSICAL  EXAMNIATION:  Gen: NAD, conversant, well nourised, obese, well groomed                     Cardiovascular: Regular rate rhythm, no peripheral edema, warm, nontender. Eyes: Conjunctivae clear without exudates or hemorrhage Neck: Supple, no carotid bruits. Pulmonary: Clear to auscultation bilaterally   NEUROLOGICAL EXAM:  MENTAL STATUS: Speech:    Speech is normal; fluent and spontaneous with normal comprehension.  Cognition:     Orientation to time, place and person     Normal recent and remote memory     Normal Attention span and concentration     Normal Language, naming, repeating,spontaneous speech     Fund of knowledge   CRANIAL NERVES: CN II: Visual fields are full to confrontation. Fundoscopic exam is normal with sharp discs and no vascular changes. Pupils are round equal and briskly reactive to light. CN III, IV, VI: extraocular movement are normal. No ptosis. CN V: Facial sensation is intact to pinprick in all 3 divisions bilaterally. Corneal responses are intact.  CN VII: Face is symmetric with normal eye closure and smile. CN VIII: Hearing is normal to rubbing fingers CN IX, X: Palate elevates symmetrically. Phonation is normal. CN XI: Head turning and shoulder shrug are intact CN XII: Tongue is midline with normal movements and no atrophy.  MOTOR: There is no pronator drift of out-stretched arms. Muscle bulk and tone are normal. Muscle strength is normal.  REFLEXES: Reflexes are 2+ and symmetric at the biceps, triceps, knees, and ankles. Plantar responses are flexor.  SENSORY: Intact to light touch, pinprick, positional sensation and vibratory sensation are intact in fingers and toes.  COORDINATION: Rapid alternating movements and fine finger movements are intact. There is no dysmetria on finger-to-nose and heel-knee-shin.    GAIT/STANCE: Posture is normal. Gait is steady with normal steps, base, arm swing, and turning. Heel and toe walking are normal. Tandem gait  is normal.  Romberg is absent.   DIAGNOSTIC DATA (LABS, IMAGING, TESTING) - I reviewed patient records, labs, notes, testing and imaging myself where available.   ASSESSMENT AND PLAN  Crystal Conner is a 41 y.o. female   Recurrent spells of transient loss of consciousness  Differentiation diagnosis including complex partial seizure  MRI of the brain with and without contrast  EEG  Started lamotrigine titrating to 100 mg twice a day  Document of the event   Marcial Pacas, M.D. Ph.D.  [facility] [address], [place], [place] [address] Ph: [phone] Fax: [phone]  CC: Helane Rima, MD

## 2017-07-24 ENCOUNTER — Encounter: Payer: Self-pay | Admitting: Neurology

## 2017-07-24 ENCOUNTER — Encounter: Payer: Self-pay | Admitting: *Deleted

## 2017-07-24 NOTE — Telephone Encounter (Signed)
Patient is requesting to be out of work, through 08/03/17, while starting her new medication.  Feels it is causing excessive daytime somnolence.  Ok, per Dr. Krista Blue, to provide note.  It has been placed up front and the patient's mother will bring her to pick it up.  She has been instructed to call us for any further concerns.

## 2017-07-25 ENCOUNTER — Encounter: Payer: Self-pay | Admitting: Neurology

## 2017-07-25 ENCOUNTER — Encounter: Payer: Self-pay | Admitting: *Deleted

## 2017-07-27 ENCOUNTER — Encounter: Payer: Self-pay | Admitting: Neurology

## 2017-07-31 ENCOUNTER — Encounter: Payer: Self-pay | Admitting: *Deleted

## 2017-07-31 ENCOUNTER — Ambulatory Visit (INDEPENDENT_AMBULATORY_CARE_PROVIDER_SITE_OTHER): Payer: Managed Care, Other (non HMO) | Admitting: Neurology

## 2017-07-31 DIAGNOSIS — R410 Disorientation, unspecified: Secondary | ICD-10-CM

## 2017-08-01 ENCOUNTER — Telehealth: Payer: Self-pay | Admitting: Neurology

## 2017-08-01 NOTE — Telephone Encounter (Signed)
Spoke to patient - she requested dates be corrected on recently completed paperwork.  Updated and re-faxed to employer.

## 2017-08-01 NOTE — Telephone Encounter (Signed)
Pt called HR advised her the letter is contradictory. It needs to state she needs accomodation to work on Tuesday - Saturday 2:30- 11:30. That is all that it needs to state, it does not need to state anything about current schedule. Please resend

## 2017-08-02 DIAGNOSIS — Z0289 Encounter for other administrative examinations: Secondary | ICD-10-CM

## 2017-08-03 NOTE — Procedures (Signed)
   HISTORY: 41 year old female, presented with recurrent spells of sudden loss of consciousness  TECHNIQUE:  16 channel EEG was performed based on standard 10-16 international system. One channel was dedicated to EKG, which has demonstrates normal sinus rhythm of 66 beats per minutes.  Upon awakening, the posterior background activity was well-developed, in alpha range,  reactive to eye opening and closure.  There was no evidence of epileptiform discharge.  Photic stimulation was performed, which induced a symmetric photic driving.  Hyperventilation was performed, there was no abnormality elicit.  No sleep was achieved.  CONCLUSION: This is a  normal awake EEG.  There is no electrodiagnostic evidence of epileptiform discharge.  Marcial Pacas, M.D. Ph.D.  [facility] [place], [place] [address] Phone: [phone] Fax:      [phone]

## 2017-08-04 ENCOUNTER — Ambulatory Visit
Admission: RE | Admit: 2017-08-04 | Discharge: 2017-08-04 | Disposition: A | Discharge status: Home / Self Care | Payer: Self-pay | Source: Ambulatory Visit | Attending: Neurology | Admitting: Neurology

## 2017-08-04 DIAGNOSIS — R410 Disorientation, unspecified: Secondary | ICD-10-CM | POA: Diagnosis not present

## 2017-08-04 MED ORDER — GADOBENATE DIMEGLUMINE 529 MG/ML IV SOLN
20.0000 mL | Freq: Once | INTRAVENOUS | Status: AC | PRN
  Administered 2017-08-04: 20 mL via INTRAVENOUS

## 2017-08-08 ENCOUNTER — Encounter: Payer: Self-pay | Admitting: Neurology

## 2017-08-21 ENCOUNTER — Encounter: Payer: Self-pay | Admitting: Neurology

## 2017-08-22 ENCOUNTER — Telehealth: Payer: Self-pay | Admitting: Neurology

## 2017-08-22 NOTE — Telephone Encounter (Signed)
Left message requesting a return call.

## 2017-08-22 NOTE — Telephone Encounter (Signed)
Check on her, her email said skin rash with lamotrigine 100mg  bid, if she still has rash, may back off to lower dose.  Has lamotrigine helped her spells?

## 2017-08-23 ENCOUNTER — Encounter: Payer: Self-pay | Admitting: *Deleted

## 2017-08-23 MED ORDER — LEVETIRACETAM 500 MG PO TABS: 500.0000 mg | ORAL_TABLET | Freq: Two times a day (BID) | ORAL | 11 refills | Status: DC

## 2017-08-23 NOTE — Telephone Encounter (Signed)
Pt returned RN's call  Call transferred to RN °

## 2017-08-23 NOTE — Addendum Note (Signed)
Addended by: Noberto Retort C on: 08/23/2017 09:02 AM   Modules accepted: Orders

## 2017-08-23 NOTE — Telephone Encounter (Signed)
Left another message requesting return call.

## 2017-08-23 NOTE — Telephone Encounter (Signed)
Spoke to patient - she has been titrating up her doses of Lamictal.  She started 100mg , BID on 08/20/17 and noticed a rash developing at this dose.  She reports the rash being all over her body this morning.  Per vo by Dr. Krista Blue, stop Lamictal and start Keppra 500mg , BID.  Patient is agreeable to this plan.  New rx sent to pharmacy.  Lamictal added to her allergy list.

## 2017-09-24 ENCOUNTER — Encounter: Payer: Self-pay | Admitting: Neurology

## 2017-09-24 ENCOUNTER — Ambulatory Visit (INDEPENDENT_AMBULATORY_CARE_PROVIDER_SITE_OTHER): Payer: Managed Care, Other (non HMO) | Admitting: Neurology

## 2017-09-24 VITALS — BP 128/74 | HR 62 | Ht 65.0 in | Wt 326.0 lb

## 2017-09-24 DIAGNOSIS — IMO0002 Reserved for concepts with insufficient information to code with codable children: Secondary | ICD-10-CM | POA: Insufficient documentation

## 2017-09-24 DIAGNOSIS — Z9884 Bariatric surgery status: Secondary | ICD-10-CM

## 2017-09-24 DIAGNOSIS — R410 Disorientation, unspecified: Secondary | ICD-10-CM | POA: Diagnosis not present

## 2017-09-24 DIAGNOSIS — G43709 Chronic migraine without aura, not intractable, without status migrainosus: Secondary | ICD-10-CM

## 2017-09-24 MED ORDER — SUMATRIPTAN SUCCINATE 50 MG PO TABS: 50.0000 mg | ORAL_TABLET | ORAL | 11 refills | Status: DC | PRN

## 2017-09-24 MED ORDER — LEVETIRACETAM 500 MG PO TABS: 500.0000 mg | ORAL_TABLET | Freq: Two times a day (BID) | ORAL | 4 refills | Status: DC

## 2017-09-24 NOTE — Progress Notes (Signed)
PATIENT: Crystal Conner DOB: Dec 20, 1975  Chief Complaint  Patient presents with  . Staring Spells/Blackouts    She is here with her mother, Crystal Conner, to review her MRI and EEG results. She is doing well on Keppra 500mg , BID. Lamical was discontinued after she developed a rash.      HISTORICAL  Crystal Conner is a 41 years old right-handed female, seen in refer by her primary care physician Dr. Lavone Neri, [place] for evaluation of recurrent spells of loss of consciousness, she is accompanied by her mother at today's clinical visit.  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   first episode was in June 2018, she was at a nail shop, soaking her nails, without warning signs, she had transient loss of consciousness, she was awake, she was attended by the technician, she thought she was just doze off.  There was multiple episode her coworker reminded her that she has transient blank stares, unresponsive lasting for a few seconds, she felt like she did not hear anything over those few seconds, no seizure activity noted.  Similar episode was also noted by her relatives when they spent few days together on a cruise trip,  She has mild bilateral pressure headache,  UPDATE Sep 24 2017: Personally reviewed MRI of the brain with and without contrast in September 2018, there was no acute abnormality.  EEG was normal on July 31 2017.  She was treated with pristique and prozac for mild depression, she is now off medication, she developed rash with lamotrigine, is on keppra 500mg  bid, initially has dizziness, now her body is adjust to it now. She has no recurrent spells, complains of frequent  headaches, lateralized, pressure, with mild light sensitivity, movement made it worse, lasting for a few hours, she is taking Aleve 2 tablets as needed   RIVIEW OF SYSTEMS: Full 14 system review of systems performed and notable only for as above ALLERGIES: Allergies  Allergen Reactions  . Penicillins Hives  . Lamictal [Lamotrigine] Rash    HOME MEDICATIONS: Current Outpatient Prescriptions  Medication Sig Dispense Refill  . cetirizine (ZYRTEC) 10 MG tablet Take 10 mg by mouth daily.    [name] EXFORGE HCT 5-160-12.5 MG TABS Take 1 tablet by mouth daily before breakfast.     . ferrous sulfate 325 (65 FE) MG tablet Take 325 mg by mouth daily with breakfast.    . levETIRAcetam (KEPPRA) 500 MG tablet Take 1 tablet (500 mg total) by mouth 2 (two) times daily. 60 tablet 11  . Norgestim-Eth Estrad Triphasic (TRI-PREVIFEM PO) Take 1 tablet by mouth daily.     No current facility-administered medications for this visit.     PAST MEDICAL HISTORY: Past Medical History:  Diagnosis Date  . Anemia   . Arthritis   . Depression   . Gallstones   . Hypertension     PAST SURGICAL HISTORY: Past Surgical History:  Procedure Laterality Date  . ABDOMINOPLASTY    . CHOLECYSTECTOMY  10/30/2012   Procedure: LAPAROSCOPIC CHOLECYSTECTOMY WITH INTRAOPERATIVE CHOLANGIOGRAM;  Surgeon: Merrie Roof, MD;  Location: [facility];  Service: General;  Laterality: N/A;  . GASTRIC BYPASS  2001  . KNEE SURGERY  1993   left knee  . TONSILECTOMY, ADENOIDECTOMY, BILATERAL MYRINGOTOMY AND TUBES      FAMILY HISTORY: Family History  Problem Relation Age of Onset  . Cancer Father        prostate  . Cancer Maternal Grandfather        breast    SOCIAL HISTORY:  Social History   Social History  . Marital status: Single    Spouse name: N/A  . Number of children: 0  . Years of education: Bachelors   Occupational History  . Customer Service Supervisor    Social History Main Topics  . Smoking status: Never  Smoker  . Smokeless tobacco: Never Used  . Alcohol use No  . Drug use: No  . Sexual activity: Not on file   Other Topics Concern  . Not on file   Social History Narrative   Lives at home alone.   Right-handed.   2-3 cups caffeine per day.     PHYSICAL EXAM   Vitals:   09/24/17 0824  BP: 128/74  Pulse: 62  Weight: (!) 326 lb (147.9 kg)  Height: 5\' 5"  (1.651 m)    Not recorded      Body mass index is 54.25 kg/m.  PHYSICAL EXAMNIATION:  Gen: NAD, conversant, well nourised, obese, well groomed                     Cardiovascular: Regular rate rhythm, no peripheral edema, warm, nontender. Eyes: Conjunctivae clear without exudates or hemorrhage Neck: Supple, no carotid bruits. Pulmonary: Clear to auscultation bilaterally   NEUROLOGICAL EXAM:  MENTAL STATUS: Speech:    Speech is normal; fluent and spontaneous with normal comprehension.  Cognition:     Orientation to time, place and person     Normal recent and remote memory     Normal Attention span and concentration     Normal Language, naming, repeating,spontaneous speech     Fund of knowledge   CRANIAL NERVES: CN II: Visual fields are full to confrontation. Fundoscopic exam is normal with sharp discs and no vascular changes. Pupils are round equal and briskly reactive to light. CN III, IV, VI: extraocular movement are normal. No ptosis. CN V: Facial sensation is intact to pinprick in all 3 divisions bilaterally. Corneal responses are intact.  CN VII: Face is symmetric with normal eye closure and smile. CN VIII: Hearing is normal to rubbing fingers CN IX, X: Palate elevates symmetrically. Phonation is normal. CN XI: Head turning and shoulder shrug are intact CN XII: Tongue is midline with normal movements and no atrophy.  MOTOR: There is no pronator drift of out-stretched arms. Muscle bulk and tone are normal. Muscle strength is normal.  REFLEXES: Reflexes are 2+ and symmetric at the biceps, triceps, knees,  and ankles. Plantar responses are flexor.  SENSORY: Intact to light touch, pinprick, positional sensation and vibratory sensation are intact in fingers and toes.  COORDINATION: Rapid alternating movements and fine finger movements are intact. There is no dysmetria on finger-to-nose and heel-knee-shin.    GAIT/STANCE: Posture is normal. Gait is steady with normal steps, base, arm swing, and turning. Heel and toe walking are normal. Tandem gait is normal.  Romberg is absent.   DIAGNOSTIC DATA (LABS, IMAGING, TESTING) - I reviewed patient records, labs, notes, testing and imaging myself where available.   ASSESSMENT AND PLAN  LEILYNN PILAT is a 41 y.o. female   Recurrent spells of transient loss of consciousness  Differentiation diagnosis including  complex partial seizure  MRI of the brain with and without contrast was normal  EEG was normal  Lamotrigine causes rash  Keppra 500 mg twice a day Chronic migraine headaches  Imitrex as needed  Marcial Pacas, M.D. Ph.D.  [facility] [address], [place], [place] [address] Ph: [phone] Fax: [phone]  CC: Helane Rima, MD

## 2017-09-26 ENCOUNTER — Encounter: Payer: Self-pay | Admitting: Neurology

## 2017-10-30 ENCOUNTER — Telehealth: Payer: Self-pay | Admitting: *Deleted

## 2017-10-30 ENCOUNTER — Encounter: Payer: Self-pay | Admitting: Neurology

## 2017-10-30 NOTE — Telephone Encounter (Signed)
Left message requesting a return call.

## 2017-10-30 NOTE — Telephone Encounter (Signed)
Spoke to patient - she is doing well on Keppra with no further staring spells. Dr. Krista Blue is unsure that her new concerns are related to her current neurological condition. She started having these symptoms with her ear infection one month ago.  She is going to follow up with her PCP and will call us back if they feel she needs to be further evaluated by neurology.  Pt agreeable to this plan.

## 2017-10-30 NOTE — Telephone Encounter (Signed)
Email received from patient:  Hello,  I have noticed that when I move my head from side to side or bend down and back up, I have a tingling feelings in face. This has been going on for a month or so. I thought it was because of the ear infection I had. It was a bad infection and took almost the whole 10 days of antibiotics for my ear to feel better. Do you know what could be causing this issue?

## 2018-03-25 ENCOUNTER — Ambulatory Visit: Payer: Managed Care, Other (non HMO) | Admitting: Nurse Practitioner

## 2018-04-09 ENCOUNTER — Other Ambulatory Visit: Payer: Self-pay | Admitting: *Deleted

## 2018-04-09 MED ORDER — SUMATRIPTAN SUCCINATE 50 MG PO TABS: 50.0000 mg | ORAL_TABLET | ORAL | 0 refills | Status: DC | PRN

## 2018-10-31 ENCOUNTER — Other Ambulatory Visit: Payer: Self-pay

## 2018-10-31 ENCOUNTER — Emergency Department (HOSPITAL_BASED_OUTPATIENT_CLINIC_OR_DEPARTMENT_OTHER): Payer: Self-pay

## 2018-10-31 ENCOUNTER — Emergency Department (HOSPITAL_BASED_OUTPATIENT_CLINIC_OR_DEPARTMENT_OTHER)
Admission: EM | Admit: 2018-10-31 | Discharge: 2018-11-01 | Disposition: A | Discharge status: Home / Self Care | Payer: Self-pay | Attending: Emergency Medicine | Admitting: Emergency Medicine

## 2018-10-31 ENCOUNTER — Encounter (HOSPITAL_BASED_OUTPATIENT_CLINIC_OR_DEPARTMENT_OTHER): Payer: Self-pay | Admitting: *Deleted

## 2018-10-31 DIAGNOSIS — R569 Unspecified convulsions: Secondary | ICD-10-CM | POA: Insufficient documentation

## 2018-10-31 DIAGNOSIS — S01511A Laceration without foreign body of lip, initial encounter: Secondary | ICD-10-CM

## 2018-10-31 DIAGNOSIS — I10 Essential (primary) hypertension: Secondary | ICD-10-CM | POA: Insufficient documentation

## 2018-10-31 DIAGNOSIS — Y999 Unspecified external cause status: Secondary | ICD-10-CM | POA: Insufficient documentation

## 2018-10-31 DIAGNOSIS — S01512A Laceration without foreign body of oral cavity, initial encounter: Secondary | ICD-10-CM | POA: Insufficient documentation

## 2018-10-31 DIAGNOSIS — Z79899 Other long term (current) drug therapy: Secondary | ICD-10-CM | POA: Insufficient documentation

## 2018-10-31 DIAGNOSIS — Y93G3 Activity, cooking and baking: Secondary | ICD-10-CM | POA: Insufficient documentation

## 2018-10-31 DIAGNOSIS — Y92000 Kitchen of unspecified non-institutional (private) residence as  the place of occurrence of the external cause: Secondary | ICD-10-CM | POA: Insufficient documentation

## 2018-10-31 DIAGNOSIS — W19XXXA Unspecified fall, initial encounter: Secondary | ICD-10-CM | POA: Insufficient documentation

## 2018-10-31 HISTORY — DX: Unspecified convulsions: R56.9

## 2018-10-31 MED ORDER — LORAZEPAM 1 MG PO TABS
1.0000 mg | ORAL_TABLET | Freq: Once | ORAL | Status: AC
  Administered 2018-10-31: 1 mg via ORAL
  Filled 2018-10-31: qty 1

## 2018-10-31 MED ORDER — BUPIVACAINE HCL 0.5 % IJ SOLN
10.0000 mL | Freq: Once | INTRAMUSCULAR | Status: AC
  Administered 2018-11-01: 10 mL
  Filled 2018-10-31: qty 1

## 2018-10-31 MED ORDER — LEVETIRACETAM 500 MG PO TABS
1000.0000 mg | ORAL_TABLET | Freq: Once | ORAL | Status: AC
  Administered 2018-10-31: 1000 mg via ORAL
  Filled 2018-10-31: qty 2

## 2018-10-31 MED ORDER — LIDOCAINE HCL (PF) 1 % IJ SOLN
5.0000 mL | Freq: Once | INTRAMUSCULAR | Status: AC
Start: 2018-10-31 — End: 2018-11-01
  Administered 2018-11-01: 5 mL via INTRADERMAL
  Filled 2018-10-31: qty 5
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
## 2018-11-01 LAB — CBC WITH DIFFERENTIAL/PLATELET
Abs Immature Granulocytes: 0.03 10*3/uL (ref 0.00–0.07)
BASOS PCT: 1 %
Basophils Absolute: 0.1 10*3/uL (ref 0.0–0.1)
Eosinophils Absolute: 0.2 10*3/uL (ref 0.0–0.5)
Eosinophils Relative: 3 %
HCT: 35.6 % — ABNORMAL LOW (ref 36.0–46.0)
Hemoglobin: 11.5 g/dL — ABNORMAL LOW (ref 12.0–15.0)
Immature Granulocytes: 0 %
Lymphocytes Relative: 19 %
Lymphs Abs: 1.8 10*3/uL (ref 0.7–4.0)
MCH: 29 pg (ref 26.0–34.0)
MCHC: 32.3 g/dL (ref 30.0–36.0)
MCV: 89.9 fL (ref 80.0–100.0)
MONO ABS: 0.4 10*3/uL (ref 0.1–1.0)
Monocytes Relative: 5 %
NEUTROS ABS: 6.7 10*3/uL (ref 1.7–7.7)
Neutrophils Relative %: 72 %
Platelets: 373 10*3/uL (ref 150–400)
RBC: 3.96 MIL/uL (ref 3.87–5.11)
RDW: 12.7 % (ref 11.5–15.5)
WBC: 9.2 10*3/uL (ref 4.0–10.5)
nRBC: 0 % (ref 0.0–0.2)

## 2018-11-01 LAB — BASIC METABOLIC PANEL
Anion gap: 9 (ref 5–15)
BUN: 15 mg/dL (ref 6–20)
CO2: 23 mmol/L (ref 22–32)
Calcium: 8.5 mg/dL — ABNORMAL LOW (ref 8.9–10.3)
Chloride: 105 mmol/L (ref 98–111)
Creatinine, Ser: 0.97 mg/dL (ref 0.44–1.00)
GFR calc Af Amer: >60 mL/min (ref >60)
GLUCOSE: 107 mg/dL — AB (ref 70–99)
Potassium: 2.8 mmol/L — ABNORMAL LOW (ref 3.5–5.1)
Sodium: 137 mmol/L (ref 135–145)

## 2018-11-01 MED ORDER — POTASSIUM CHLORIDE CRYS ER 20 MEQ PO TBCR: 40.0000 meq | EXTENDED_RELEASE_TABLET | Freq: Two times a day (BID) | ORAL | 0 refills | Status: DC

## 2018-11-01 MED ORDER — FLUCONAZOLE 150 MG PO TABS: 150.0000 mg | ORAL_TABLET | Freq: Once | ORAL | 0 refills | Status: AC

## 2018-11-01 MED ORDER — TETANUS-DIPHTH-ACELL PERTUSSIS 5-2.5-18.5 LF-MCG/0.5 IM SUSP
0.5000 mL | Freq: Once | INTRAMUSCULAR | Status: AC
  Administered 2018-11-01: 0.5 mL via INTRAMUSCULAR
  Filled 2018-11-01: qty 0.5

## 2018-11-01 MED ORDER — DIPHENHYDRAMINE HCL 50 MG/ML IJ SOLN
25.0000 mg | Freq: Once | INTRAMUSCULAR | Status: AC
  Administered 2018-11-01: 25 mg via INTRAVENOUS
  Filled 2018-11-01: qty 1

## 2018-11-01 MED ORDER — POTASSIUM CHLORIDE CRYS ER 20 MEQ PO TBCR
60.0000 meq | EXTENDED_RELEASE_TABLET | Freq: Once | ORAL | Status: AC
  Administered 2018-11-01: 60 meq via ORAL

## 2018-11-01 MED ORDER — LIDOCAINE-EPINEPHRINE (PF) 2 %-1:200000 IJ SOLN
10.0000 mL | Freq: Once | INTRAMUSCULAR | Status: AC
  Administered 2018-11-01: 10 mL via INTRADERMAL
  Filled 2018-11-01 (×2): qty 10

## 2018-11-01 MED ORDER — METOCLOPRAMIDE HCL 5 MG/ML IJ SOLN
10.0000 mg | Freq: Once | INTRAMUSCULAR | Status: AC
  Administered 2018-11-01: 10 mg via INTRAVENOUS
  Filled 2018-11-01: qty 2

## 2018-11-01 MED ORDER — CLINDAMYCIN HCL 150 MG PO CAPS: 300.0000 mg | ORAL_CAPSULE | Freq: Three times a day (TID) | ORAL | 0 refills | Status: AC

## 2018-11-01 MED ORDER — MAGNESIUM OXIDE 400 MG PO TABS: 400.0000 mg | ORAL_TABLET | Freq: Two times a day (BID) | ORAL | 0 refills | Status: AC

## 2018-11-01 MED ORDER — POTASSIUM CHLORIDE CRYS ER 20 MEQ PO TBCR
EXTENDED_RELEASE_TABLET | ORAL | Status: AC
  Filled 2018-11-01: qty 1

## 2018-11-01 MED ORDER — POTASSIUM CHLORIDE CRYS ER 20 MEQ PO TBCR
40.0000 meq | EXTENDED_RELEASE_TABLET | Freq: Once | ORAL | Status: DC
  Filled 2018-11-01: qty 2

## 2018-11-01 NOTE — Discharge Instructions (Addendum)
For your seizures, call your Neurologist tomorrow to discuss possible med changes  Take your seizure meds as prescribed  Take your potassium as prescribed  For your wound: Keep the wound moist with triple antibiotic ointment until healed Follow-up in 3-5 days for suture removal Keep the wound out of the sun Be very careful when brushing your teeth EAT SOFT FOODS UNTIL SUTURES ARE REMOVED  IT IS ALSO VERY IMPORTANT TO TAKE YOUR POTASSIUM.

## 2019-07-18 ENCOUNTER — Ambulatory Visit (INDEPENDENT_AMBULATORY_CARE_PROVIDER_SITE_OTHER): Payer: BC Managed Care – PPO | Admitting: Allergy

## 2019-07-18 ENCOUNTER — Encounter: Payer: Self-pay | Admitting: Allergy

## 2019-07-18 ENCOUNTER — Other Ambulatory Visit: Payer: Self-pay

## 2019-07-18 VITALS — BP 130/74 | HR 82 | Temp 98.2°F | Resp 18 | Ht 66.0 in | Wt 304.6 lb

## 2019-07-18 DIAGNOSIS — J3089 Other allergic rhinitis: Secondary | ICD-10-CM | POA: Diagnosis not present

## 2019-07-18 DIAGNOSIS — T781XXD Other adverse food reactions, not elsewhere classified, subsequent encounter: Secondary | ICD-10-CM | POA: Diagnosis not present

## 2019-07-18 MED ORDER — AZELASTINE HCL 0.1 % NA SOLN: 2.0000 | Freq: Two times a day (BID) | NASAL | 5 refills | Status: DC | PRN

## 2019-07-18 NOTE — Patient Instructions (Addendum)
-   environmental allergy skin testing is positive to grass pollen, weed pollen, molds, cat and dog - allergen avoidance measures discussed/handouts provided - would trial Allegra 180mg  or Xyzal 5mg  daily to replace Claritin if more effective in controlling allergy symptoms - for nasal congestion continue use of Fluticasone 2 sprays each nostril daily.  Use for 1-2 weeks at a time before stopping once symptoms resolve.   - for nasal drainage use Astelin 2 sprays each nostril twice a day as needed - recommend use of nasal saline rinse to help clean/flush out nose and help medicated nasal sprays to be more effective - will consider Xhance (fluticasone nasal spray device) if above nasal regimen is not effective enough - allergen immunotherapy discussed today including protocol, benefits and risk.  Informational handout provided.  If interested in this therapuetic option you can check with your insurance carrier for coverage.  Let us know if you would like to proceed with this option.    - select food allergy skin testing is negative to chicken and egg.  Thus likely food intolerance and not IgE mediated food allergy.   Would however continue to avoid these foods to prevent onset of GI symptoms.  Epinephrine device is not warranted at this time   Follow-up 3-4 months or sooner if needed

## 2019-07-18 NOTE — Progress Notes (Signed)
New Patient Note  RE: Crystal Conner MRN: [id number] DOB: [birth year] Date of Office Visit: 07/18/2019  Referring provider: Helane Rima, MD Primary care provider: Helane Rima, MD  Chief Complaint: allergies  History of present illness: Crystal Conner is a 43 y.o. female presenting today for consultation for allergies.    She has seen allergist at [facility] allergy about 3 years ago.  She was on allergen immunotherapy for 2-3 years before stopping.  She states she was developing large local reactions and thus stopped.  She states when she came off of immunotherapy she did not have any further sinus infections.    However this year has been bad for her in regards to her allergies.  She is having HA, sinus pressure, ear fullness, nasal drainage and congestion.   She has been taking claritin in the morning over this past year.  She use to take zyrtec at night in addition to claritin.  She stopped taking zyrtec as she started getting a 'weird feeling' in legs in the mornings.  This improved with stopping zyrtec.   She is using fluticasone nasal spray daily which helps somewhat.   She denies any eye symptoms.   She has had 3 rounds of prednisone for symptoms this year.  She does report symptoms do clear with steroids.  She has not had any antibiotics.      Her workplace is very dusty and she reports allergy to dust mites.    No history of asthma or eczema.    She reports she can't eat chicken or stove-top eggs as of this year as will develop diarrhea pretty quickly after ingestion.  She has had an episode of vomiting with chicken ingestion as well.    Review of systems: Review of Systems  Constitutional: Negative for chills, fever and malaise/fatigue.  HENT: Positive for congestion, ear pain and sinus pain. Negative for ear discharge, nosebleeds and sore throat.   Eyes: Negative for pain, discharge and redness.  Respiratory: Negative.   Cardiovascular: Negative.    Gastrointestinal: Negative.   Musculoskeletal: Negative.   Skin: Negative for itching and rash.  Neurological: Positive for headaches.    All other systems negative unless noted above in HPI  Past medical history: Past Medical History:  Diagnosis Date  . Anemia   . Arthritis   . Depression   . Gallstones   . Hypertension   . Seizures ([place])     Past surgical history: Past Surgical History:  Procedure Laterality Date  . ABDOMINOPLASTY    . ADENOIDECTOMY    . CHOLECYSTECTOMY  10/30/2012   Procedure: LAPAROSCOPIC CHOLECYSTECTOMY WITH INTRAOPERATIVE CHOLANGIOGRAM;  Surgeon: Merrie Roof, MD;  Location: [facility];  Service: General;  Laterality: N/A;  . GASTRIC BYPASS  2001  . KNEE SURGERY  1993   left knee  . TONSILECTOMY, ADENOIDECTOMY, BILATERAL MYRINGOTOMY AND TUBES    . TONSILLECTOMY    . TYMPANOSTOMY TUBE PLACEMENT      Family history:  Family History  Problem Relation Age of Onset  . Cancer Father        prostate  . Cancer Maternal Grandfather        breast  . Allergic rhinitis Brother     Social history: Lives in a townhome without carpeting with gas heating and central cooling.  No pets in the home.  No concern for water damage, mildew or roaches in the home.   She is a Pharmacist, hospital.  Denies smoking history.  Medication List: Allergies as of 07/18/2019      Reactions   Penicillins Hives   Lamictal [lamotrigine] Rash      Medication List       Accurate as of July 18, 2019  3:44 PM. If you have any questions, ask your nurse or doctor.        STOP taking these medications   Exforge HCT 5-160-12.5 MG Tabs Generic drug: amLODIPine-Valsartan-HCTZ Stopped by: Eryx Zane Charmian Muff, MD   ferrous sulfate 325 (65 FE) MG tablet Stopped by: Delois Tolbert Charmian Muff, MD   SUMAtriptan 50 MG tablet Commonly known as: Imitrex Stopped by: Jerusalem Brownstein Charmian Muff, MD     TAKE these medications   cetirizine 10 MG tablet Commonly known as:  ZYRTEC Take 10 mg by mouth daily.   D3 + K2 DOTS PO Take by mouth.   levETIRAcetam 500 MG tablet Commonly known as: KEPPRA Take 1 tablet (500 mg total) by mouth 2 (two) times daily.   potassium chloride SA 20 MEQ tablet Commonly known as: K-DUR Take 2 tablets (40 mEq total) by mouth 2 (two) times daily for 5 days.   topiramate 100 MG tablet Commonly known as: TOPAMAX Take 100 mg by mouth 2 (two) times daily.   TRI-PREVIFEM PO Take 1 tablet by mouth daily.   valsartan-hydrochlorothiazide 160-25 MG tablet Commonly known as: DIOVAN-HCT Take 1 tablet by mouth daily.   vitamin B-12 250 MCG tablet Commonly known as: CYANOCOBALAMIN Take 250 mcg by mouth daily.       Known medication allergies: Allergies  Allergen Reactions  . Penicillins Hives  . Lamictal [Lamotrigine] Rash     Physical examination: Blood pressure 130/74, pulse 82, temperature 98.2 F (36.8 C), temperature source Temporal, resp. rate 18, height 5\' 6"  (1.676 m), weight (!) 304 lb 9.6 oz (138.2 kg), SpO2 97 %.  General: Alert, interactive, in no acute distress. HEENT: PERRLA, TMs pearly gray, turbinates moderately edematous with clear discharge, post-pharynx non erythematous with mild cobblestoning of posterior oropharynx. Neck: Supple without lymphadenopathy. Lungs: Clear to auscultation without wheezing, rhonchi or rales. {no increased work of breathing. CV: Normal S1, S2 without murmurs. Abdomen: Nondistended, nontender. Skin: Warm and dry, without lesions or rashes. Extremities:  No clubbing, cyanosis or edema. Neuro:   Grossly intact.  Diagnositics/Labs: Allergy testing: environmental allergy skin prick testing is positive to Urda grass, alternaria.   Intradermal testing is positive to ragweed mix, weed mix, mold mix 4, cat, dog.   Skin prick to egg and chicken are negative.   Allergy testing results were read and interpreted by provider, documented by clinical staff.   Assessment and  plan:   Allergic rhinitis - environmental allergy skin testing is positive to grass pollen, weed pollen, molds, cat and dog - allergen avoidance measures discussed/handouts provided - would trial Allegra 180mg  or Xyzal 5mg  daily to replace Claritin if more effective in controlling allergy symptoms - for nasal congestion continue use of Fluticasone 2 sprays each nostril daily.  Use for 1-2 weeks at a time before stopping once symptoms resolve.   - for nasal drainage use Astelin 2 sprays each nostril twice a day as needed - recommend use of nasal saline rinse to help clean/flush out nose and help medicated nasal sprays to be more effective - will consider Xhance (fluticasone nasal spray device) if above nasal regimen is not effective enough - allergen immunotherapy discussed today including protocol, benefits and risk.  Informational handout provided.  If interested in this therapuetic option you  can check with your insurance carrier for coverage.  Let us know if you would like to proceed with this option.    Food intolerance - select food allergy skin testing is negative to chicken and egg.  Thus likely food intolerance and not IgE mediated food allergy.   Would however continue to avoid these foods to prevent onset of GI symptoms.  Epinephrine device is not warranted at this time   Follow-up 3-4 months or sooner if needed  I appreciate the opportunity to take part in Hanalei's care. Please do not hesitate to contact me with questions.  Sincerely,   Prudy Feeler, MD Allergy/Immunology [facility] [place] [facility] [place]

## 2019-11-18 ENCOUNTER — Telehealth: Payer: Self-pay

## 2019-11-18 NOTE — Telephone Encounter (Signed)
Dr. Nelva Bush please advise a change in medication since she is having these issues with the Pepcid. Thank You.

## 2019-11-18 NOTE — Telephone Encounter (Signed)
Called patient and left a detailed voicemail per [facility] permission stating that this medication is not something that Dr. Nelva Bush has prescribed or has discussed with the patient and it is recommended that she contact the provider that did want her to take this medication to discuss the issues that she is having with this medication. Asked patient to call back with any further questions.

## 2019-11-18 NOTE — Telephone Encounter (Signed)
Patient called stating the pepcid she is taking is giving her tachycardia and anxiety. Patient no longer wants to take this medication. 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   She is still on the zyrtec as prescribed.   Please Advise.

## 2019-11-18 NOTE — Telephone Encounter (Signed)
Why is she taking Pepcid? I did not discuss reflux or hives with her at visit for her to be on Pepcid; thus I do not have a reason for her to take Pepcid. Did someone else recommend her take this?  If so would advise she discuss with that person regarding changing.  Or is she referring to a different medication that I recommended for her to take?

## 2019-11-19 ENCOUNTER — Ambulatory Visit: Payer: Managed Care, Other (non HMO) | Admitting: Allergy
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
## 2019-12-10 NOTE — Progress Notes (Signed)
[facility], [place] [address] [place] [address] Phone: [phone] Fax: [place] [address] [place] [address] [place] [address] Phone: [phone] Fax: [phone]      Your procedure is scheduled on January 11  Report to [facility] Main Entrance "A" at 0530 A.M., and check in at the Admitting office.  Call this number if you have problems the morning of surgery:  [phone]  Call [phone] if you have any questions prior to your surgery date Monday-Friday 8am-4pm    Remember:  Do not eat after midnight the night before your surgery  You may drink clear liquids until 0430 am the morning of your surgery.   Clear liquids allowed are: Water, Non-Citrus Juices (without pulp), Carbonated Beverages, Clear Tea, Black Coffee Only, and Gatorade    Take these medicines the morning of surgery with A SIP OF WATER  azelastine (ASTELIN)  If needed desvenlafaxine (PRISTIQ)  dicyclomine (BENTYL) fexofenadine (ALLEGRA) levETIRAcetam (KEPPRA) topiramate (TOPAMAX)  7 days prior to surgery STOP taking any Aspirin (unless otherwise instructed by your surgeon), Aleve, Naproxen, Ibuprofen, Motrin, Advil, Goody's, BC's, all herbal medications, fish oil, and all vitamins.    The Morning of Surgery  Do not wear jewelry, make-up or nail polish.  Do not wear lotions, powders, or perfumes/colognes, or deodorant  Do not shave 48 hours prior to surgery.    Do not bring valuables to the hospital.  [facility] is not responsible for any belongings or valuables.  If you are a smoker, DO NOT Smoke 24 hours prior to surgery  If you wear a CPAP at night please bring your mask, tubing, and machine the morning of surgery   Remember that you must have someone to transport you home after your surgery, and remain with you for 24 hours if you are discharged the  same day.   Please bring cases for contacts, glasses, hearing aids, dentures or bridgework because it cannot be worn into surgery.    Leave your suitcase in the car.  After surgery it may be brought to your room.  For patients admitted to the hospital, discharge time will be determined by your treatment team.  Patients discharged the day of surgery will not be allowed to drive home.    Special instructions:   [facility]- Preparing For Surgery  Before surgery, you can play an important role. Because skin is not sterile, your skin needs to be as free of germs as possible. You can reduce the number of germs on your skin by washing with CHG (chlorahexidine gluconate) Soap before surgery.  CHG is an antiseptic cleaner which kills germs and bonds with the skin to continue killing germs even after washing.    Oral Hygiene is also important to reduce your risk of infection.  Remember - BRUSH YOUR TEETH THE MORNING OF SURGERY WITH YOUR REGULAR TOOTHPASTE  Please do not use if you have an allergy to CHG or antibacterial soaps. If your skin becomes reddened/irritated stop using the CHG.  Do not shave (including legs and underarms) for at least 48 hours prior to first CHG shower. It is OK to shave your face.  Please follow these instructions carefully.   1. Shower the NIGHT BEFORE SURGERY and the MORNING OF SURGERY with CHG Soap.   2. If you chose to wash your hair, wash your hair first as usual  with your normal shampoo.  3. After you shampoo, rinse your hair and body thoroughly to remove the shampoo.  4. Use CHG as you would any other liquid soap. You can apply CHG directly to the skin and wash gently with a scrungie or a clean washcloth.   5. Apply the CHG Soap to your body ONLY FROM THE NECK DOWN.  Do not use on open wounds or open sores. Avoid contact with your eyes, ears, mouth and genitals (private parts). Wash Face and genitals (private parts)  with your normal soap.   6. Wash  thoroughly, paying special attention to the area where your surgery will be performed.  7. Thoroughly rinse your body with warm water from the neck down.  8. DO NOT shower/wash with your normal soap after using and rinsing off the CHG Soap.  9. Pat yourself dry with a CLEAN TOWEL.  10. Wear CLEAN PAJAMAS to bed the night before surgery, wear comfortable clothes the morning of surgery  11. Place CLEAN SHEETS on your bed the night of your first shower and DO NOT SLEEP WITH PETS.    Day of Surgery:  Please shower the morning of surgery with the CHG soap Do not apply any deodorants/lotions. Please wear clean clothes to the hospital/surgery center.   Remember to brush your teeth WITH YOUR REGULAR TOOTHPASTE.   Please read over the following fact sheets that you were given.

## 2019-12-11 ENCOUNTER — Encounter (HOSPITAL_COMMUNITY)
Admission: RE | Admit: 2019-12-11 | Discharge: 2019-12-11 | Disposition: A | Discharge status: Home / Self Care | Payer: BC Managed Care – PPO | Source: Ambulatory Visit | Attending: Obstetrics and Gynecology | Admitting: Obstetrics and Gynecology

## 2019-12-11 ENCOUNTER — Other Ambulatory Visit (HOSPITAL_COMMUNITY)
Admission: RE | Admit: 2019-12-11 | Discharge: 2019-12-11 | Disposition: A | Discharge status: Home / Self Care | Payer: BC Managed Care – PPO | Source: Ambulatory Visit | Attending: Obstetrics and Gynecology | Admitting: Obstetrics and Gynecology

## 2019-12-11 ENCOUNTER — Encounter (HOSPITAL_COMMUNITY): Payer: Self-pay

## 2019-12-11 ENCOUNTER — Other Ambulatory Visit: Payer: Self-pay

## 2019-12-11 DIAGNOSIS — Z01812 Encounter for preprocedural laboratory examination: Secondary | ICD-10-CM | POA: Diagnosis present

## 2019-12-11 DIAGNOSIS — Z01810 Encounter for preprocedural cardiovascular examination: Secondary | ICD-10-CM | POA: Insufficient documentation

## 2019-12-11 DIAGNOSIS — Z20822 Contact with and (suspected) exposure to covid-19: Secondary | ICD-10-CM | POA: Insufficient documentation

## 2019-12-11 HISTORY — DX: Fibromyalgia: M79.7

## 2019-12-11 LAB — BASIC METABOLIC PANEL
Anion gap: 8 (ref 5–15)
BUN: 14 mg/dL (ref 6–20)
CO2: 20 mmol/L — ABNORMAL LOW (ref 22–32)
Calcium: 8.8 mg/dL — ABNORMAL LOW (ref 8.9–10.3)
Chloride: 114 mmol/L — ABNORMAL HIGH (ref 98–111)
Creatinine, Ser: 1.04 mg/dL — ABNORMAL HIGH (ref 0.44–1.00)
GFR calc Af Amer: >60 mL/min (ref >60)
GFR calc non Af Amer: >60 mL/min (ref >60)
Glucose, Bld: 89 mg/dL (ref 70–99)
Potassium: 3.2 mmol/L — ABNORMAL LOW (ref 3.5–5.1)
Sodium: 142 mmol/L (ref 135–145)

## 2019-12-11 LAB — TYPE AND SCREEN
ABO/RH(D): O POS
Antibody Screen: NEGATIVE

## 2019-12-11 LAB — CBC
HCT: 38.7 % (ref 36.0–46.0)
Hemoglobin: 12.5 g/dL (ref 12.0–15.0)
MCH: 29.8 pg (ref 26.0–34.0)
MCHC: 32.3 g/dL (ref 30.0–36.0)
MCV: 92.4 fL (ref 80.0–100.0)
Platelets: 533 10*3/uL — ABNORMAL HIGH (ref 150–400)
RBC: 4.19 MIL/uL (ref 3.87–5.11)
RDW: 12.8 % (ref 11.5–15.5)
WBC: 7.2 10*3/uL (ref 4.0–10.5)
nRBC: 0 % (ref 0.0–0.2)

## 2019-12-11 LAB — ABO/RH: ABO/RH(D): O POS

## 2019-12-11 NOTE — Progress Notes (Signed)
PCP - Dr. Helane Rima Cardiologist - Denies  PPM/ICD - Denies  Chest x-ray - N/A EKG - 12/11/2019 Stress Test - Denies ECHO - Denies Cardiac Cath - Denies  Sleep Study - 01/2017 for epilepsy  Patient denies being a diabetic.  Blood Thinner Instructions: N/A Aspirin Instructions: N/A  ERAS Protcol - Yes, no drink  COVID TEST- Scheduled 12/11/2019   Coronavirus Screening  Have you experienced the following symptoms:  Cough yes/no: No Fever (>100.69F)  yes/no: No Runny nose yes/no: No Sore throat yes/no: No Difficulty breathing/shortness of breath  yes/no: No  Have you or a family member traveled in the last 14 days and where? yes/no: No   If the patient indicates "YES" to the above questions, their PAT will be rescheduled to limit the exposure to others and, the surgeon will be notified. THE PATIENT WILL NEED TO BE ASYMPTOMATIC FOR 14 DAYS.   If the patient is not experiencing any of these symptoms, the PAT nurse will instruct them to NOT bring anyone with them to their appointment since they may have these symptoms or traveled as well.   Please remind your patients and families that hospital visitation restrictions are in effect and the importance of the restrictions.     Anesthesia review: yes, abnormal EKG  Patient denies shortness of breath, fever, cough and chest pain at PAT appointment   All instructions explained to the patient, with a verbal understanding of the material. Patient agrees to go over the instructions while at home for a better understanding. Patient also instructed to self quarantine after being tested for COVID-19. The opportunity to ask questions was provided.

## 2019-12-12 NOTE — Anesthesia Preprocedure Evaluation (Addendum)
Anesthesia Evaluation  Patient identified by MRN, date of birth, ID band Patient awake    Reviewed: Allergy & Precautions, NPO status , Patient's Chart, lab work & pertinent test results  History of Anesthesia Complications Negative for: history of anesthetic complications  Airway Mallampati: I  TM Distance: >3 FB Neck ROM: Full    Dental  (+) Dental Advisory Given   Pulmonary neg pulmonary ROS,  12/11/2019 SARS CoV2 NEG   breath sounds clear to auscultation       Cardiovascular hypertension, Pt. on medications (-) angina Rhythm:Regular Rate:Normal     Neuro/Psych  Headaches, Seizures -, Well Controlled,  Depression    GI/Hepatic Neg liver ROS, S/p Roux-en-Y gastric bypass   Endo/Other  Morbid obesity  Renal/GU negative Renal ROS     Musculoskeletal  (+) Fibromyalgia -  Abdominal (+) + obese,   Peds  Hematology negative hematology ROS (+)   Anesthesia Other Findings   Reproductive/Obstetrics                            Anesthesia Physical Anesthesia Plan  ASA: III  Anesthesia Plan: General   Post-op Pain Management:    Induction: Intravenous  PONV Risk Score and Plan: 4 or greater and Scopolamine patch - Pre-op, Dexamethasone and Ondansetron  Airway Management Planned: Oral ETT  Additional Equipment:   Intra-op Plan:   Post-operative Plan: Extubation in OR  Informed Consent: I have reviewed the patients History and Physical, chart, labs and discussed the procedure including the risks, benefits and alternatives for the proposed anesthesia with the patient or authorized representative who has indicated his/her understanding and acceptance.     Dental advisory given  Plan Discussed with: CRNA and Surgeon  Anesthesia Plan Comments: (PAT note written 12/12/2019 by Myra Gianotti, PA-C. )      Anesthesia Quick Evaluation

## 2019-12-12 NOTE — Progress Notes (Addendum)
Anesthesia Chart Review:  Case: [id number] Date/Time: 12/15/19 0715   Procedure: HYSTERECTOMY ABDOMINAL with salpingectomy (Bilateral )   Anesthesia type: Choice   Pre-op diagnosis: AUB, pelvic pain, fibroids   Location: [facility] OR ROOM 08 / [place] [facility]   Surgeons: Louretta Shorten, MD      DISCUSSION: Patient is a 44 year old female scheduled for the above procedure.  History includes never smoker, HTN, anemia, uterine fibroids, seizures (right temporal lobe seizures by EEG monitoring 01/23/18), fibromyalgia, IBS, cholecystectomy (10/30/12), Roux-en-Y gastric bypass (2001).  BMI is consistent with morbid obesity.  She denied shortness of breath, cough, fever, chest pain at PAT RN visit.  Presurgical COVID-19 test from 12/11/2019 is still in process.  Based on currently documented menstrual status, she will need a urine pregnancy test on the day of surgery.  If these tests are negative and otherwise no acute changes then I would anticipate that she may proceed as planned.    VS: BP 115/78   Pulse 73   Temp 37.2 C (Oral)   Resp 18   Ht 5\' 7"  (1.702 m)   Wt 132.6 kg   LMP 11/26/2019   SpO2 100%   BMI 45.79 kg/m     PROVIDERS: Helane Rima, MD his PCP [facility] Care Everywhere) Cammy Brochure, MD is neurologist [facility] Everywhere) Melissa Montane, MD is GI ([place])   LABS: Labs reviewed: Acceptable for surgery. (all labs ordered are listed, but only abnormal results are displayed)  Labs Reviewed  BASIC METABOLIC PANEL - Abnormal; Notable for the following components:      Result Value   Potassium 3.2 (*)    Chloride 114 (*)    CO2 20 (*)    Creatinine, Ser 1.04 (*)    Calcium 8.8 (*)    All other components within normal limits  CBC - Abnormal; Notable for the following components:   Platelets 533 (*)    All other components within normal limits  TYPE AND SCREEN  ABO/RH    EKG: 12/11/19: Sinus bradycardia with sinus arrhythmia with 1st degree A-V block Nonspecific T  wave abnormality Abnormal ECG No significant change since last tracing [11/01/18] Confirmed by Fransico Him ([id number]) on 12/11/2019 5:09:59 PM   CV: N/A  Past Medical History:  Diagnosis Date  . Anemia   . Arthritis   . Depression   . Fibromyalgia   . Gallstones   . Hypertension   . Seizures ([place])     Past Surgical History:  Procedure Laterality Date  . ABDOMINOPLASTY    . ADENOIDECTOMY    . CHOLECYSTECTOMY  10/30/2012   Procedure: LAPAROSCOPIC CHOLECYSTECTOMY WITH INTRAOPERATIVE CHOLANGIOGRAM;  Surgeon: Merrie Roof, MD;  Location: [facility];  Service: General;  Laterality: N/A;  . GASTRIC BYPASS  2001  . KNEE SURGERY  1993   left knee  . TONSILECTOMY, ADENOIDECTOMY, BILATERAL MYRINGOTOMY AND TUBES    . TONSILLECTOMY    . TYMPANOSTOMY TUBE PLACEMENT      MEDICATIONS: . azelastine (ASTELIN) 0.1 % nasal spray  . desvenlafaxine (PRISTIQ) 100 MG 24 hr tablet  . dicyclomine (BENTYL) 20 MG tablet  . fexofenadine (ALLEGRA) 180 MG tablet  . levETIRAcetam (KEPPRA) 500 MG tablet  . potassium chloride SA (K-DUR,KLOR-CON) 20 MEQ tablet  . topiramate (TOPAMAX) 50 MG tablet  . valsartan-hydrochlorothiazide (DIOVAN-HCT) 160-12.5 MG tablet   No current facility-administered medications for this encounter.    Myra Gianotti, PA-C Surgical Short Stay/Anesthesiology [facility] Phone [phone] [facility] [place] Phone [phone] 12/12/2019 10:54  AM        

## 2019-12-13 LAB — NOVEL CORONAVIRUS, NAA (HOSP ORDER, SEND-OUT TO REF LAB; TAT 18-24 HRS): SARS-CoV-2, NAA: NOT DETECTED

## 2019-12-14 NOTE — H&P (Addendum)
Crystal Conner is an 44 y.o. female presenting for TAH/ Bilateral Salpingectomy.  She does have a history of 12 week size fibroids. She has been taking continuous oral contraceptive agent. She has been having breakthrough bleeding including a lot of pain during her bleeding and most recently had some severe pain and currently she is not bleeding today. She has also restarted and doses changed on Keppra. Her neurologist is concerned about the increased dosing effect, her birth control pills and effectiveness. At this time, Crystal Conner is not sexually active, so she is not worried that much about contraception, but she is worried about the fact she is having a lot more breakthrough bleeding and pelvic pain due to this.   Patient's last menstrual period was 11/26/2019.    Past Medical History:  Diagnosis Date  . Anemia   . Arthritis   . Depression   . Fibromyalgia   . Gallstones   . Hypertension   . Seizures ([place])     Past Surgical History:  Procedure Laterality Date  . ABDOMINOPLASTY    . ADENOIDECTOMY    . CHOLECYSTECTOMY  10/30/2012   Procedure: LAPAROSCOPIC CHOLECYSTECTOMY WITH INTRAOPERATIVE CHOLANGIOGRAM;  Surgeon: Merrie Roof, MD;  Location: [facility];  Service: General;  Laterality: N/A;  . GASTRIC BYPASS  2001  . KNEE SURGERY  1993   left knee  . TONSILECTOMY, ADENOIDECTOMY, BILATERAL MYRINGOTOMY AND TUBES    . TONSILLECTOMY    . TYMPANOSTOMY TUBE PLACEMENT      Family History  Problem Relation Age of Onset  . Cancer Father        prostate  . Cancer Maternal Grandfather        breast  . Allergic rhinitis Brother     Social History:  reports that she has never smoked. She has never used smokeless tobacco. She reports that she does not drink alcohol or use drugs.  Allergies:  Allergies  Allergen Reactions  . Penicillins Hives    Did it involve swelling of the face/tongue/throat, SOB, or low BP? No Did it involve sudden or severe rash/hives, skin peeling, or any  reaction on the inside of your mouth or nose? No Did you need to seek medical attention at a hospital or doctor's office? No When did it last happen?10+ years ago If all above answers are "NO", may proceed with cephalosporin use.   . Lamictal [Lamotrigine] Rash    No medications prior to admission.    Review of Systems       Print Patient Name MACII, ARD [id number], F) ID# [id number] Appt. Date/Time 12/09/2019 09:10AM DOB 1976-02-14 Service Dept. [address] Provider Latasha Puskas Dominic Pea M.D. Insurance  Med Primary[place] Insurance # : [id number] Policy/Group # : [id number] Employer Name : [employer] Prescription: [place] - Member is eligible. details Chief Complaint *GYN FOLLOW-UP  Pre-op 12/15/2019/TAH/BS @[facility]  Patient's Pharmacies [facility] [address] [facility]): [place], [place], [place] [address], Ph [phone], Fax [phone] [id number]): [place], [place], [place] [address], Ph [phone], Fax [phone] [place] [id number] ([place]): [id number] [place], [place] [address], Ph [phone], Fax [phone] Vitals Wt: 297.2 lbs 12/09/2019 08:46 am Ht: 5 ft 6 in 12/09/2019 08:46 am BMI: 48 12/09/2019 08:46 am BP: 112/82 12/09/2019 08:47 am Allergies Reviewed Allergies PENICILLINS: Hives (Mild) - can take amoxil  Dust Mites, Mold, Mildew Medications Reviewed Medications desvenlafaxine succinate ER 100 mg tablet,extended release 24 hr TAKE 1 TABLET BY  MOUTH ONCE DAILY IN THE MORNING 09/20/19   filled [facility] dicyclomine 20 mg tablet 10/09/19   filled [facility] fluticasone propionate 50 mcg/actuation nasal spray,suspension 06/11/19   filled [facility] L norgest/E estradiol-E estrad 0.15 mg-30 mcg (84)/10 mcg(7) tabs,57mos TAKE 1 TABLET BY MOUTH EVERY DAY AS DIRECTED 11/24/19   renewed Luz Lex M.D. 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 Date of Last Mammogram: 02/04/2019. Date of Last Colonoscopy: (Notes: [facility]/6 years ago). Date of Last Pap Smear: 04/09/2018 (Notes: WNL). History of Abnormal PAP: Y. Sexually Active: N. History of Sexually Transmitted Infection: N. Current Birth Control Method:: Combined Oral Contraceptive Pills. Obstetric History Reviewed Obstetric History Past Medical History Reviewed Past Medical History Cardiology- High Blood Pressure: Y Hematology- Anemia: Y Neurology- Headaches/Migraines: Y Neurology- Seizures/Epilepsy: Y Screening None recorded. HPI Crystal Conner presents today for discussion regarding options for abnormal bleeding and pelvic pain. She does have a history of 12 week size fibroids. She has been taking continuous oral contraceptive agent. She has been having breakthrough bleeding including a lot of pain during her bleeding and most recently had some severe pain and currently she is not bleeding today. She has also restarted and doses changed on Keppra. Her neurologist is concerned about the increased dosing effect, her birth control pills and effectiveness. At this time, Crystal Conner is not sexually active, so she is not worried that much about contraception, but she is worried about the fact she is having a lot more breakthrough bleeding and pelvic pain due to this.  She plans to  proceed with TAH, Bil Salpingectomy  ROS Constitutional: Constitutional: no significant weight gain or loss and no fatigue or fever.  Skin: Skin: no rashes or abnormal moles.  Respiratory: Respiratory: no wheezing or dyspnea / shortness of breath.  Cardiovascular: Cardiovascular: no palpitations or chest pain.  Gastrointestinal: Gastrointestinal: no heartburn, dysphagia, nausea, vomiting, diarrhea, constipation, bowel movement changes, or rectal bleeding.  Genitourinary: Genitourinary: no vaginal odor or itching; no hematuria, incontinence, rash, lesion, discharge, flank pain, or trouble urinating; and abnormal bleeding.  Endocrine: Menstrual: no PMDD symptoms. Menopausal: no menopausal symptoms. Sexual: no sexual problems.  Musculoskeletal: Musculoskeletal: no muscle aches or weakness and no arthralgias/joint pain or back pain.  Neurological: Neurologic: no headaches, dizziness, LOC, weakness, or numbness.  Psychological: Psych: no depression, alcoholism, or sleep disturbances.  Last menstrual period 11/26/2019. Physical Exam  Patient is a 44 year old female.  Constitutional: *General Appearance: healthy-appearing, well-nourished, and well-developed.  Head: Head: normocephalic.  Neck: *Thyroid: non-tender and no enlargement.  Lymph Nodes: *Palpation: normal.  Cardiovascular: *Auscultation: RRR and no murmur. *Peripheral Vascular: no varicosities or edema.  Lungs: *Auscultation: clear to auscultation. Inspection: normal and normal respiratory rate.  *Breast: Bilateral: no tenderness, skin changes, abnormal nipple secretions, or masses palpable and nipple appearance: normal. Right Breast: normal. Left Breast: normal.  Abdomen: *Inspection/Palpation/Auscultation: no tenderness, rebound, guarding, hepatomegaly, or splenomegaly and non-distended and soft. *Hernia: none palpated.  Back: Appearance  normal. Palpation no costovertebral angle tenderness.  Female Genitalia: Vulva:  no masses, atrophy, or lesions. Mons: no erythema, excoriation, atrophy, lesions, masses, swelling, tenderness, or vesicles/ ulcers and normal. Labia Majora: no erythema, excoriation, atrophy, discoloration, lesions, masses, swelling, tenderness, or vesicles/ ulcers and normal. Labia Minora: no erythema, excoriation, atrophy, discoloration, lesions, vesicles, masses, swelling, or tenderness and normal. Introitus: normal. Bartholin's Gland: normal. *Vagina: no discharge, erythema, atrophy, lesions, ulcers, swelling, masses, tenderness, prolapse, or blood present and normal. *Cervix: no lesions, discharge, bleeding, or cervical motion tenderness and grossly normal. *Uterus: fibroids, contour irregular, and enlarged 12 weeks size and midline, mobile, non-tender, and no uterine prolapse. *Urethral Meatus/ Urethra: no discharge, masses, or tenderness and normal meatus and well supported urethra. *Bladder: non-distended, non-tender, and no palpable mass. *Adnexa/Parametria: no tenderness or mass palpable.  Extremities: Legs: normal.  Skin: *Appearance: no rashes or lesions.  Psychiatric: *Orientation: to person, place, and time. *Mood and Affect: normal mood and affect and active and alert.   No results found for this or any previous visit (from the past 24 hour(s)).  No results found.  Assessment/Plan: 1. Uterine leiomyoma - 12 weeks size causing aub, pelvic pain. She wants to proceed with TAH, Bilateral salpingectomy. Discussed the risks and benefits of the procedure including but not limited to infection, bleeding, damage to bowel, bladder, ureters, and risks associated with blood transfusion. We reviewed the procedure at length including the recovery. All here questions were answered and she gives informed consent. D25.9: Leiomyoma of uterus, unspecified  2. Pain in pelvis R10.2: Pelvic and perineal pain  3. Abnormal uterine bleeding N93.9: Abnormal uterine and vaginal bleeding,  unspecified  Luz Lex 12/14/2019, 1:40 PM   This patient has been seen and examined.   All of her questions were answered.  Labs and vital signs reviewed.  Informed consent has been obtained.  The History and Physical is current. 12/15/19 DL

## 2019-12-15 ENCOUNTER — Inpatient Hospital Stay (HOSPITAL_COMMUNITY)
Admission: RE | Admit: 2019-12-15 | Discharge: 2019-12-17 | DRG: 743 | Disposition: A | Discharge status: Home / Self Care | Payer: BC Managed Care – PPO | Attending: Obstetrics and Gynecology | Admitting: Obstetrics and Gynecology

## 2019-12-15 ENCOUNTER — Inpatient Hospital Stay (HOSPITAL_COMMUNITY): Payer: BC Managed Care – PPO | Admitting: Vascular Surgery

## 2019-12-15 ENCOUNTER — Encounter (HOSPITAL_COMMUNITY): Payer: Self-pay | Admitting: Obstetrics and Gynecology

## 2019-12-15 ENCOUNTER — Other Ambulatory Visit: Payer: Self-pay

## 2019-12-15 ENCOUNTER — Encounter (HOSPITAL_COMMUNITY)
Admission: RE | Disposition: A | Discharge status: Home / Self Care | Payer: Self-pay | Source: Home / Self Care | Attending: Obstetrics and Gynecology

## 2019-12-15 ENCOUNTER — Inpatient Hospital Stay (HOSPITAL_COMMUNITY): Payer: BC Managed Care – PPO | Admitting: Anesthesiology

## 2019-12-15 DIAGNOSIS — G40909 Epilepsy, unspecified, not intractable, without status epilepticus: Secondary | ICD-10-CM | POA: Diagnosis present

## 2019-12-15 DIAGNOSIS — I10 Essential (primary) hypertension: Secondary | ICD-10-CM | POA: Diagnosis present

## 2019-12-15 DIAGNOSIS — D259 Leiomyoma of uterus, unspecified: Principal | ICD-10-CM | POA: Diagnosis present

## 2019-12-15 DIAGNOSIS — Z20822 Contact with and (suspected) exposure to covid-19: Secondary | ICD-10-CM | POA: Diagnosis present

## 2019-12-15 DIAGNOSIS — M797 Fibromyalgia: Secondary | ICD-10-CM | POA: Diagnosis present

## 2019-12-15 DIAGNOSIS — Z90710 Acquired absence of both cervix and uterus: Secondary | ICD-10-CM | POA: Diagnosis present

## 2019-12-15 DIAGNOSIS — Z79899 Other long term (current) drug therapy: Secondary | ICD-10-CM | POA: Diagnosis not present

## 2019-12-15 DIAGNOSIS — D649 Anemia, unspecified: Secondary | ICD-10-CM | POA: Diagnosis present

## 2019-12-15 DIAGNOSIS — K660 Peritoneal adhesions (postprocedural) (postinfection): Secondary | ICD-10-CM | POA: Diagnosis present

## 2019-12-15 HISTORY — PX: ABDOMINAL HYSTERECTOMY: SHX81

## 2019-12-15 LAB — POCT PREGNANCY, URINE: Preg Test, Ur: NEGATIVE

## 2019-12-15 SURGERY — HYSTERECTOMY, ABDOMINAL
Anesthesia: General | Site: Abdomen | Laterality: Bilateral

## 2019-12-15 MED ORDER — SIMETHICONE 80 MG PO CHEW: 80.0000 mg | CHEWABLE_TABLET | Freq: Four times a day (QID) | ORAL | Status: DC | PRN

## 2019-12-15 MED ORDER — DEXTROSE-NACL 5-0.45 % IV SOLN: INTRAVENOUS | Status: DC

## 2019-12-15 MED ORDER — MENTHOL 3 MG MT LOZG: 1.0000 | LOZENGE | OROMUCOSAL | Status: DC | PRN

## 2019-12-15 MED ORDER — KETOROLAC TROMETHAMINE 30 MG/ML IJ SOLN: 30.0000 mg | Freq: Three times a day (TID) | INTRAMUSCULAR | Status: DC | PRN

## 2019-12-15 MED ORDER — FENTANYL CITRATE (PF) 250 MCG/5ML IJ SOLN
INTRAMUSCULAR | Status: AC
  Filled 2019-12-15: qty 5

## 2019-12-15 MED ORDER — SODIUM CHLORIDE 0.9 % IV SOLN
INTRAVENOUS | Status: AC
  Filled 2019-12-15: qty 2

## 2019-12-15 MED ORDER — ZOLPIDEM TARTRATE 5 MG PO TABS: 5.0000 mg | ORAL_TABLET | Freq: Every evening | ORAL | Status: DC | PRN

## 2019-12-15 MED ORDER — HYDROMORPHONE HCL 1 MG/ML IJ SOLN
0.2500 mg | INTRAMUSCULAR | Status: DC | PRN
  Administered 2019-12-15: 0.5 mg via INTRAVENOUS

## 2019-12-15 MED ORDER — LEVETIRACETAM 500 MG PO TABS
1000.0000 mg | ORAL_TABLET | Freq: Every day | ORAL | Status: DC
  Administered 2019-12-16 – 2019-12-17 (×2): 1000 mg via ORAL
  Filled 2019-12-15 (×2): qty 2

## 2019-12-15 MED ORDER — NALOXONE HCL 0.4 MG/ML IJ SOLN: 0.4000 mg | INTRAMUSCULAR | Status: DC | PRN

## 2019-12-15 MED ORDER — SCOPOLAMINE 1 MG/3DAYS TD PT72: 1.0000 | MEDICATED_PATCH | Freq: Once | TRANSDERMAL | Status: DC

## 2019-12-15 MED ORDER — HYDROMORPHONE HCL 1 MG/ML IJ SOLN
INTRAMUSCULAR | Status: AC
  Filled 2019-12-15: qty 1

## 2019-12-15 MED ORDER — DIPHENHYDRAMINE HCL 50 MG/ML IJ SOLN
INTRAMUSCULAR | Status: DC | PRN
  Administered 2019-12-15: 12.5 mg via INTRAVENOUS

## 2019-12-15 MED ORDER — SODIUM CHLORIDE 0.9% FLUSH: 9.0000 mL | INTRAVENOUS | Status: DC | PRN

## 2019-12-15 MED ORDER — LIDOCAINE 2% (20 MG/ML) 5 ML SYRINGE
INTRAMUSCULAR | Status: AC
  Filled 2019-12-15: qty 5

## 2019-12-15 MED ORDER — ARTIFICIAL TEARS OPHTHALMIC OINT
TOPICAL_OINTMENT | OPHTHALMIC | Status: AC
  Filled 2019-12-15: qty 3.5

## 2019-12-15 MED ORDER — MIDAZOLAM HCL 2 MG/2ML IJ SOLN
INTRAMUSCULAR | Status: AC
  Filled 2019-12-15: qty 2

## 2019-12-15 MED ORDER — PROPOFOL 10 MG/ML IV BOLUS
INTRAVENOUS | Status: DC | PRN
  Administered 2019-12-15: 150 mg via INTRAVENOUS

## 2019-12-15 MED ORDER — ONDANSETRON HCL 4 MG/2ML IJ SOLN: 4.0000 mg | Freq: Four times a day (QID) | INTRAMUSCULAR | Status: DC | PRN

## 2019-12-15 MED ORDER — MIDAZOLAM HCL 2 MG/2ML IJ SOLN: 0.5000 mg | Freq: Once | INTRAMUSCULAR | Status: DC | PRN

## 2019-12-15 MED ORDER — PROMETHAZINE HCL 25 MG/ML IJ SOLN: 6.2500 mg | INTRAMUSCULAR | Status: DC | PRN

## 2019-12-15 MED ORDER — DEXAMETHASONE SODIUM PHOSPHATE 10 MG/ML IJ SOLN
INTRAMUSCULAR | Status: DC | PRN
  Administered 2019-12-15: 5 mg via INTRAVENOUS

## 2019-12-15 MED ORDER — MEPERIDINE HCL 25 MG/ML IJ SOLN: 6.2500 mg | INTRAMUSCULAR | Status: DC | PRN

## 2019-12-15 MED ORDER — ACETAMINOPHEN 500 MG PO TABS: 1000.0000 mg | ORAL_TABLET | Freq: Once | ORAL | Status: AC

## 2019-12-15 MED ORDER — FENTANYL CITRATE (PF) 250 MCG/5ML IJ SOLN
INTRAMUSCULAR | Status: DC | PRN
  Administered 2019-12-15: 50 ug via INTRAVENOUS
  Administered 2019-12-15: 100 ug via INTRAVENOUS

## 2019-12-15 MED ORDER — DEXAMETHASONE SODIUM PHOSPHATE 10 MG/ML IJ SOLN
INTRAMUSCULAR | Status: AC
  Filled 2019-12-15: qty 1

## 2019-12-15 MED ORDER — ALUM & MAG HYDROXIDE-SIMETH 200-200-20 MG/5ML PO SUSP: 30.0000 mL | ORAL | Status: DC | PRN

## 2019-12-15 MED ORDER — SODIUM CHLORIDE 0.9 % IV SOLN
2.0000 g | INTRAVENOUS | Status: AC
  Administered 2019-12-15: 2 g via INTRAVENOUS

## 2019-12-15 MED ORDER — LEVETIRACETAM 750 MG PO TABS
1500.0000 mg | ORAL_TABLET | Freq: Every day | ORAL | Status: DC
  Administered 2019-12-15 – 2019-12-16 (×2): 1500 mg via ORAL
  Filled 2019-12-15 (×2): qty 2

## 2019-12-15 MED ORDER — TOPIRAMATE 25 MG PO TABS
150.0000 mg | ORAL_TABLET | Freq: Two times a day (BID) | ORAL | Status: DC
  Administered 2019-12-15 – 2019-12-17 (×4): 150 mg via ORAL
  Filled 2019-12-15 (×4): qty 2

## 2019-12-15 MED ORDER — SCOPOLAMINE 1 MG/3DAYS TD PT72
MEDICATED_PATCH | TRANSDERMAL | Status: AC
  Administered 2019-12-15: 1.5 mg via TRANSDERMAL
  Filled 2019-12-15: qty 1

## 2019-12-15 MED ORDER — ONDANSETRON HCL 4 MG/2ML IJ SOLN
INTRAMUSCULAR | Status: DC | PRN
  Administered 2019-12-15: 4 mg via INTRAVENOUS

## 2019-12-15 MED ORDER — MIDAZOLAM HCL 2 MG/2ML IJ SOLN
INTRAMUSCULAR | Status: DC | PRN
  Administered 2019-12-15: 2 mg via INTRAVENOUS

## 2019-12-15 MED ORDER — ACETAMINOPHEN 500 MG PO TABS
ORAL_TABLET | ORAL | Status: AC
  Administered 2019-12-15: 1000 mg via ORAL
  Filled 2019-12-15: qty 2

## 2019-12-15 MED ORDER — OXYCODONE HCL 5 MG/5ML PO SOLN: 5.0000 mg | Freq: Once | ORAL | Status: DC | PRN

## 2019-12-15 MED ORDER — DIPHENHYDRAMINE HCL 50 MG/ML IJ SOLN: 12.5000 mg | Freq: Four times a day (QID) | INTRAMUSCULAR | Status: DC | PRN

## 2019-12-15 MED ORDER — DIPHENHYDRAMINE HCL 12.5 MG/5ML PO ELIX: 12.5000 mg | ORAL_SOLUTION | Freq: Four times a day (QID) | ORAL | Status: DC | PRN

## 2019-12-15 MED ORDER — DIPHENHYDRAMINE HCL 50 MG/ML IJ SOLN
INTRAMUSCULAR | Status: AC
  Filled 2019-12-15: qty 1

## 2019-12-15 MED ORDER — ROCURONIUM BROMIDE 10 MG/ML (PF) SYRINGE
PREFILLED_SYRINGE | INTRAVENOUS | Status: DC | PRN
  Administered 2019-12-15: 70 mg via INTRAVENOUS

## 2019-12-15 MED ORDER — CHLORHEXIDINE GLUCONATE CLOTH 2 % EX PADS
6.0000 | MEDICATED_PAD | Freq: Every day | CUTANEOUS | Status: DC
  Administered 2019-12-16: 6 via TOPICAL

## 2019-12-15 MED ORDER — SUGAMMADEX SODIUM 200 MG/2ML IV SOLN
INTRAVENOUS | Status: DC | PRN
  Administered 2019-12-15: 265.2 mg via INTRAVENOUS

## 2019-12-15 MED ORDER — LACTATED RINGERS IV SOLN: INTRAVENOUS | Status: DC

## 2019-12-15 MED ORDER — FENTANYL 40 MCG/ML IV SOLN
INTRAVENOUS | Status: DC
  Administered 2019-12-15: 1000 ug via INTRAVENOUS
  Administered 2019-12-15: 40 ug via INTRAVENOUS
  Administered 2019-12-15: 20 ug via INTRAVENOUS
  Administered 2019-12-15: 110 ug via INTRAVENOUS
  Administered 2019-12-15: 30 ug via INTRAVENOUS
  Filled 2019-12-15: qty 25

## 2019-12-15 MED ORDER — PROPOFOL 10 MG/ML IV BOLUS
INTRAVENOUS | Status: AC
  Filled 2019-12-15: qty 20

## 2019-12-15 MED ORDER — ONDANSETRON HCL 4 MG/2ML IJ SOLN
INTRAMUSCULAR | Status: AC
  Filled 2019-12-15: qty 2

## 2019-12-15 MED ORDER — ROCURONIUM BROMIDE 10 MG/ML (PF) SYRINGE
PREFILLED_SYRINGE | INTRAVENOUS | Status: AC
  Filled 2019-12-15: qty 10

## 2019-12-15 MED ORDER — LIDOCAINE 2% (20 MG/ML) 5 ML SYRINGE
INTRAMUSCULAR | Status: DC | PRN
  Administered 2019-12-15: 40 mg via INTRAVENOUS

## 2019-12-15 MED ORDER — OXYCODONE HCL 5 MG PO TABS: 5.0000 mg | ORAL_TABLET | Freq: Once | ORAL | Status: DC | PRN

## 2019-12-15 SURGICAL SUPPLY — 37 items
CANISTER SUCT 3000ML PPV (MISCELLANEOUS) ×2 IMPLANT
COVER WAND RF STERILE (DRAPES) ×2 IMPLANT
DRAPE WARM FLUID 44X44 (DRAPES) IMPLANT
DRSG OPSITE POSTOP 4X10 (GAUZE/BANDAGES/DRESSINGS) ×2 IMPLANT
DURAPREP 26ML APPLICATOR (WOUND CARE) ×2 IMPLANT
GAUZE 4X4 16PLY RFD (DISPOSABLE) ×2 IMPLANT
GLOVE BIOGEL PI IND STRL 7.0 (GLOVE) ×2 IMPLANT
GLOVE BIOGEL PI INDICATOR 7.0 (GLOVE) ×2
GLOVE SURG ORTHO 8.0 STRL STRW (GLOVE) ×2 IMPLANT
GOWN STRL REUS W/ TWL LRG LVL3 (GOWN DISPOSABLE) ×3 IMPLANT
GOWN STRL REUS W/TWL LRG LVL3 (GOWN DISPOSABLE) ×6
HEMOSTAT ARISTA ABSORB 3G PWDR (HEMOSTASIS) IMPLANT
HIBICLENS CHG 4% 4OZ BTL (MISCELLANEOUS) ×2 IMPLANT
KIT TURNOVER KIT B (KITS) ×2 IMPLANT
LIGASURE IMPACT 36 18CM CVD LR (INSTRUMENTS) ×2 IMPLANT
NS IRRIG 1000ML POUR BTL (IV SOLUTION) ×2 IMPLANT
PACK ABDOMINAL GYN (CUSTOM PROCEDURE TRAY) ×2 IMPLANT
PAD ARMBOARD 7.5X6 YLW CONV (MISCELLANEOUS) ×2 IMPLANT
PAD OB MATERNITY 4.3X12.25 (PERSONAL CARE ITEMS) ×2 IMPLANT
RETRACTOR WND ALEXIS 25 LRG (MISCELLANEOUS) IMPLANT
RTRCTR WOUND ALEXIS 25CM LRG (MISCELLANEOUS)
SPONGE LAP 18X18 RF (DISPOSABLE) IMPLANT
SUT MNCRL 0 MO-4 VIOLET 18 CR (SUTURE) ×1 IMPLANT
SUT MNCRL 0 VIOLET 6X18 (SUTURE) ×1 IMPLANT
SUT MNCRL AB 3-0 PS2 27 (SUTURE) ×2 IMPLANT
SUT MONOCRYL 0 6X18 (SUTURE) ×1
SUT MONOCRYL 0 MO 4 18  CR/8 (SUTURE) ×1
SUT PDS AB 0 CT1 27 (SUTURE) IMPLANT
SUT PDS AB 0 CTX 60 (SUTURE) IMPLANT
SUT PLAIN 3 0 CT 1 27 (SUTURE) ×2 IMPLANT
SUT VIC AB 0 CT1 18XCR BRD8 (SUTURE) IMPLANT
SUT VIC AB 0 CT1 8-18 (SUTURE)
SUT VIC AB 1 CTX 36 (SUTURE)
SUT VIC AB 1 CTX36XBRD ANBCTRL (SUTURE) IMPLANT
SUT VIC AB 2-0 CT1 (SUTURE) ×2 IMPLANT
TOWEL GREEN STERILE FF (TOWEL DISPOSABLE) ×4 IMPLANT
TRAY FOLEY W/BAG SLVR 14FR (SET/KITS/TRAYS/PACK) ×2 IMPLANT

## 2019-12-15 NOTE — Transfer of Care (Signed)
Immediate Anesthesia Transfer of Care Note  Patient: Crystal Conner  Procedure(s) Performed: HYSTERECTOMY ABDOMINAL with bilateral salpingectomy (Bilateral Abdomen)  Patient Location: PACU  Anesthesia Type:General  Level of Consciousness: drowsy and patient cooperative  Airway & Oxygen Therapy: Patient Spontanous Breathing and Patient connected to face mask oxygen  Post-op Assessment: Report given to RN and Post -op Vital signs reviewed and stable  Post vital signs: Reviewed and stable  Last Vitals:  Vitals Value Taken Time  BP    Temp    Pulse 70 12/15/19 0900  Resp 7 12/15/19 0900  SpO2 92 % 12/15/19 0900  Vitals shown include unvalidated device data.  Last Pain:  Vitals:   12/15/19 0618  TempSrc: Oral         Complications: No apparent anesthesia complications

## 2019-12-15 NOTE — Anesthesia Procedure Notes (Signed)
Procedure Name: Intubation Date/Time: 12/15/2019 7:59 AM Performed by: Kathryne Hitch, CRNA Pre-anesthesia Checklist: Patient identified, Emergency Drugs available, Suction available and Patient being monitored Patient Re-evaluated:Patient Re-evaluated prior to induction Oxygen Delivery Method: Circle system utilized Preoxygenation: Pre-oxygenation with 100% oxygen Induction Type: IV induction Ventilation: Mask ventilation without difficulty Laryngoscope Size: Miller and 2 Grade View: Grade I Tube type: Oral Tube size: 7.0 mm Number of attempts: 1 Airway Equipment and Method: Stylet and Oral airway Placement Confirmation: ETT inserted through vocal cords under direct vision,  positive ETCO2 and breath sounds checked- equal and bilateral Secured at: 22 cm Tube secured with: Tape Dental Injury: Teeth and Oropharynx as per pre-operative assessment

## 2019-12-15 NOTE — Progress Notes (Signed)
Day of Surgery Procedure(s) (LRB): HYSTERECTOMY ABDOMINAL with bilateral salpingectomy (Bilateral)  Subjective: Patient reports incisional pain and tolerating PO.    Objective: I have reviewed patient's vital signs, intake and output, medications, labs and surgery findings.  General: alert, cooperative, appears stated age and no distress Inc CD&I Assessment: s/p Procedure(s): HYSTERECTOMY ABDOMINAL with bilateral salpingectomy (Bilateral): stable and progressing well  Plan: Advance diet Encourage ambulation  LOS: 0 days    Luz Lex 12/15/2019, 6:21 PM

## 2019-12-15 NOTE — Op Note (Signed)
NAME: Crystal Conner, Crystal Conner MEDICAL RECORD [id number] ACCOUNT [id number] DATE OF BIRTH:May 05, 1976 FACILITY: [facility] LOCATION: [facility] PHYSICIAN:Saamir Armstrong Dominic Pea, MD  OPERATIVE REPORT  DATE OF PROCEDURE:  12/15/2019  PREOPERATIVE DIAGNOSES:   1.  A 12-week size fibroid uterus.  2.  Abnormal uterine bleeding. 3.  Pelvic pain.  POSTOPERATIVE DIAGNOSES:   1.  A 12-week size fibroid uterus.  2.  Abnormal uterine bleeding. 3.  Pelvic pain.  PROCEDURE:  Total abdominal hysterectomy with bilateral salpingectomy.  SURGEON:  Louretta Shorten, MD   ASSISTANT:  Lucillie Garfinkel, MD  ANESTHESIA:  General endotracheal.  INDICATIONS:  The patient is a 44 year old with a 12-week size fibroid uterus, taking continuous oral contraceptive agents, having breakthrough bleeding and a lot of pain during her cycle, some so severe, having any time.  Her neurologist is concerned  about birth control pills and the effects on her anti-seizure medication.  Currently, at the time she is not sexually active.  She has no childbearing desires and she is frustrated with her abnormal bleeding and pelvic pain and presents for hysterectomy.   Risks and benefits of the procedure were discussed at length and informed consent was obtained.  FINDINGS:  At time of surgery were a 12-week size fibroid uterus, adhesions from the bladder to the anterior abdominal wall.  Normal appearing ovaries and fallopian tubes.  DESCRIPTION OF PROCEDURE:  After adequate analgesia, the patient was placed in the supine position.  She was sterilely prepped and draped.  Foley catheter was sterilely placed.  A Pfannenstiel skin incision was made 2 fingerbreadths above the pubic  symphysis, taken down sharply to the fascia, which was incised transversely and extended superiorly and inferiorly off the bellies of the rectus muscle.  The peritoneum was entered sharply.  There were adhesions from the bladder to the anterior abdominal  wall.  This was taken down  sharply.  Care was taken to avoid injury to the bladder.  The bowel was packed cephalad.  An O'Connor-O'Sullivan retractor was placed.  The uterus was grasped with thyroid tenaculum.  Kelly clamps were placed across the  uteroovarian ligaments bilaterally.  LigaSure instrument was used to ligate across the uterine ligament, down across the round ligament and to the inferior portion of the broad ligament and down across the uterine vasculature.  This was done bilaterally.   The bladder was then dissected off the anterior surface of the cervix.  The LigaSure instrument was then used to ligate across the cardinal ligaments down to the uterosacral ligaments.  After assuring that the bladder was completely off the anterior  surface of the cervix, Heaney clamps were placed across the uterosacral ligaments bilaterally.  Mayo scissors were used to enter the vagina and remove the entire cervix, uterus and it was passed off the table.  A suture of 0 Monocryl suture was used in a  figure-of-eight fashion on the uterosacral ligament.  The vagina was then closed with figure-of-eights of 0 Monocryl suture and the uterosacral ligaments were plicated in the midline.  The bladder was then retrograde filled with sterile milk to 300 mL  of distention without any evidence of compromise of the bladder integrity.  It was then drained again with the Foley catheter.  Each fallopian tube was identified, grasped and LigaSure instrument was used to ligate across the mesosalpinx, removing the  left and right fallopian tube with good hemostasis achieved.  The ovaries appeared to be normal, as was the cul-de-sac.  Minimal bleeding was noted and care had been  taken to avoid bowel or bladder injury or ureter injury.  The retractor was then  removed.  The packing was removed.  The rectus was plicated in the midline using 2-0 Vicryl.  The fascia was then closed with a PDS double- stranded in a running fashion.  The Camper fascia was then  reapproximated with 0 plain suture and the skin was  closed with 4-0 Vicryl Rapide with good approximation and good hemostasis achieved.  The patient received 2 g of cefotetan preoperatively.  Estimated blood loss was 100 mL.  The patient was stable and transferred to the recovery room.  VN/NUANCE  D:12/15/2019 T:12/15/2019 JOB:[id number]

## 2019-12-15 NOTE — Progress Notes (Signed)
Received patient from PACU. Patient very drowsy but easily arousable. Honeycomb dressing clean, dry, and intact. Patient oriented to call bell and bed controls. Instructed patient not to self ambulate and to utilize call bell for assistance. Will continue to monitor.

## 2019-12-15 NOTE — Anesthesia Postprocedure Evaluation (Signed)
Anesthesia Post Note  Patient: Crystal Conner  Procedure(s) Performed: HYSTERECTOMY ABDOMINAL with bilateral salpingectomy (Bilateral Abdomen)     Patient location during evaluation: PACU Anesthesia Type: General Level of consciousness: awake and alert, patient cooperative and oriented Pain management: pain level controlled (pain improving) Vital Signs Assessment: post-procedure vital signs reviewed and stable Respiratory status: spontaneous breathing, nonlabored ventilation and respiratory function stable Cardiovascular status: blood pressure returned to baseline and stable Postop Assessment: no apparent nausea or vomiting Anesthetic complications: no    Last Vitals:  Vitals:   12/15/19 1037 12/15/19 1306  BP: 132/79   Pulse: (!) 59   Resp: 17 14  Temp: 36.7 C   SpO2: 100% 97%    Last Pain:  Vitals:   12/15/19 1306  TempSrc:   PainSc: 9                  Deshonda Cryderman,E. Chibuikem Thang

## 2019-12-16 LAB — CBC
HCT: 33.5 % — ABNORMAL LOW (ref 36.0–46.0)
Hemoglobin: 11.1 g/dL — ABNORMAL LOW (ref 12.0–15.0)
MCH: 29.9 pg (ref 26.0–34.0)
MCHC: 33.1 g/dL (ref 30.0–36.0)
MCV: 90.3 fL (ref 80.0–100.0)
Platelets: 507 10*3/uL — ABNORMAL HIGH (ref 150–400)
RBC: 3.71 MIL/uL — ABNORMAL LOW (ref 3.87–5.11)
RDW: 12.5 % (ref 11.5–15.5)
WBC: 10.8 10*3/uL — ABNORMAL HIGH (ref 4.0–10.5)
nRBC: 0 % (ref 0.0–0.2)

## 2019-12-16 LAB — SURGICAL PATHOLOGY

## 2019-12-16 MED ORDER — FENTANYL 40 MCG/ML IV SOLN
INTRAVENOUS | Status: DC
  Administered 2019-12-16: 0 ug via INTRAVENOUS
  Administered 2019-12-16: 5 ug via INTRAVENOUS
  Administered 2019-12-16: 15 ug via INTRAVENOUS
  Administered 2019-12-16: 45 ug via INTRAVENOUS
  Administered 2019-12-16: 30 ug via INTRAVENOUS

## 2019-12-16 MED ORDER — OXYCODONE-ACETAMINOPHEN 5-325 MG PO TABS
1.0000 | ORAL_TABLET | ORAL | Status: DC | PRN
  Administered 2019-12-16 – 2019-12-17 (×2): 2 via ORAL
  Filled 2019-12-16 (×2): qty 2

## 2019-12-16 NOTE — Progress Notes (Addendum)
Pt able to tolerate soft diet. Discontinued PCA. Wasted 76ml of fantanyl in stericycle container with Dian Queen, RN.

## 2019-12-16 NOTE — Progress Notes (Signed)
Md on call notified that patient heart rates drops to 40's patient is receiving 10 MCG Q6 mins was given verbal order to reduce to [facility] will continue to monitor. Arthor Captain LPN

## 2019-12-16 NOTE — Progress Notes (Signed)
1 Day Post-Op Procedure(s) (LRB): HYSTERECTOMY ABDOMINAL with bilateral salpingectomy (Bilateral)  Subjective: Patient reports incisional pain, tolerating PO and no problems voiding.    Objective: I have reviewed patient's vital signs, intake and output, medications and labs.  General: alert, cooperative, appears stated age and no distress GI: soft, non-tender; bowel sounds normal; no masses,  no organomegaly and incision: clean, dry and intact Vaginal Bleeding: none  Assessment: s/p Procedure(s): HYSTERECTOMY ABDOMINAL with bilateral salpingectomy (Bilateral): stable and progressing well  Plan: Advance diet Encourage ambulation Advance to PO medication Discontinue IV fluids  LOS: 1 day    Luz Lex 12/16/2019, 7:05 PM

## 2019-12-16 NOTE — Plan of Care (Signed)
  Problem: Clinical Measurements: Goal: Respiratory complications will improve Outcome: Progressing   Problem: Clinical Measurements: Goal: Cardiovascular complication will be avoided Outcome: Progressing   Problem: Nutrition: Goal: Adequate nutrition will be maintained Outcome: Progressing   Problem: Activity: Goal: Risk for activity intolerance will decrease Outcome: Progressing   Problem: Coping: Goal: Level of anxiety will decrease Outcome: Progressing   Problem: Elimination: Goal: Will not experience complications related to bowel motility Outcome: Progressing Goal: Will not experience complications related to urinary retention Outcome: Progressing   Problem: Pain Managment: Goal: General experience of comfort will improve Outcome: Progressing   Problem: Safety: Goal: Ability to remain free from injury will improve Outcome: Progressing   Problem: Skin Integrity: Goal: Risk for impaired skin integrity will decrease Outcome: Progressing

## 2019-12-17 MED ORDER — OXYCODONE-ACETAMINOPHEN 5-325 MG PO TABS: 1.0000 | ORAL_TABLET | ORAL | 0 refills | Status: DC | PRN

## 2019-12-17 NOTE — Progress Notes (Signed)
2 Days Post-Op Procedure(s) (LRB): HYSTERECTOMY ABDOMINAL with bilateral salpingectomy (Bilateral)  Subjective: Patient reports tolerating PO, + flatus and no problems voiding.    Objective: I have reviewed patient's vital signs, intake and output, medications and pathology.  General: alert, cooperative, appears stated age and no distress Extremities: extremities normal, atraumatic, no cyanosis or edema Vaginal Bleeding: none  Abd.  Soft nt BS+  INC CD&I  Assessment: s/p Procedure(s): HYSTERECTOMY ABDOMINAL with bilateral salpingectomy (Bilateral): progressing well and tolerating diet  Plan: Discharge home  LOS: 2 days    Luz Lex 12/17/2019, 10:13 AM

## 2019-12-17 NOTE — Progress Notes (Signed)
Discharged patient to home, AVS instructions given and concerns addressed. Patient is stable, ambulatory. No complaints of pain upon discharge. Belongings returned accordingly.

## 2019-12-17 NOTE — Discharge Summary (Signed)
Physician Discharge Summary  Patient ID: Crystal Conner MRN: [id number] DOB/AGE: 46/09/1976 46 y.o.  Admit date: 12/15/2019 Discharge date: 12/17/2019  Admission Diagnoses:Pelvic pain, Fibroids, AUB  Discharge Diagnoses: Same Active Problems:   S/P TAH (total abdominal hysterectomy)   Discharged Condition: good  Hospital Course: Pt underwent an uncomplicated TAH/Bil salpingectomy.  Her postop course was unremarkable with quick return of bowel and bladder function.  POD 2 tolerating regular diet and pain well controlled on oral meds and desired d/c home  Consults: None  Significant Diagnostic Studies: labs: post op Hgb 11.1  Treatments: surgery: TAH,Bil salp  Discharge Exam: Blood pressure 113/75, pulse 60, temperature 98.6 F (37 C), temperature source Oral, resp. rate 14, height 5\' 7"  (1.702 m), weight 132.6 kg, last menstrual period 11/26/2019, SpO2 99 %. General appearance: alert, cooperative, appears stated age and no distress GI: soft, non-tender; bowel sounds normal; no masses,  no organomegaly Incision/Wound:  Disposition: Discharge disposition: 01-Home or Self Care       Discharge Instructions    Call MD for:  difficulty breathing, headache or visual disturbances   Complete by: As directed    Call MD for:  persistant nausea and vomiting   Complete by: As directed    Call MD for:  redness, tenderness, or signs of infection (pain, swelling, redness, odor or green/yellow discharge around incision site)   Complete by: As directed    Call MD for:  severe uncontrolled pain   Complete by: As directed    Call MD for:  temperature >100.4   Complete by: As directed    Diet general   Complete by: As directed    Driving Restrictions   Complete by: As directed    No driving for 2 weeks   Increase activity slowly   Complete by: As directed    Lifting restrictions   Complete by: As directed    No lifting anything greater than 10 pounds (if you have to ask, don't  lift it)        Signed: Luz Lex 12/17/2019, 10:17 AM

## 2019-12-19 ENCOUNTER — Ambulatory Visit: Payer: BC Managed Care – PPO | Admitting: Allergy

## 2020-01-02 ENCOUNTER — Ambulatory Visit: Payer: BC Managed Care – PPO | Admitting: Allergy

## 2020-02-26 ENCOUNTER — Ambulatory Visit (INDEPENDENT_AMBULATORY_CARE_PROVIDER_SITE_OTHER): Payer: BC Managed Care – PPO | Admitting: Allergy

## 2020-02-26 ENCOUNTER — Encounter: Payer: Self-pay | Admitting: Allergy

## 2020-02-26 ENCOUNTER — Other Ambulatory Visit: Payer: Self-pay

## 2020-02-26 VITALS — BP 110/72 | HR 78 | Temp 97.1°F | Wt 290.2 lb

## 2020-02-26 DIAGNOSIS — J3089 Other allergic rhinitis: Secondary | ICD-10-CM

## 2020-02-26 DIAGNOSIS — K9049 Malabsorption due to intolerance, not elsewhere classified: Secondary | ICD-10-CM | POA: Diagnosis not present

## 2020-02-26 MED ORDER — IPRATROPIUM BROMIDE 0.06 % NA SOLN: 2.0000 | Freq: Four times a day (QID) | NASAL | 5 refills | Status: DC | PRN

## 2020-02-26 MED ORDER — MONTELUKAST SODIUM 10 MG PO TABS: 10.0000 mg | ORAL_TABLET | Freq: Every day | ORAL | 5 refills | Status: DC

## 2020-02-26 MED ORDER — FEXOFENADINE HCL 180 MG PO TABS: 180.0000 mg | ORAL_TABLET | Freq: Every day | ORAL | 5 refills | Status: DC

## 2020-02-26 MED ORDER — AZELASTINE HCL 0.1 % NA SOLN: 2.0000 | Freq: Two times a day (BID) | NASAL | 5 refills | Status: DC | PRN

## 2020-02-26 MED ORDER — EPINEPHRINE 0.3 MG/0.3ML IJ SOAJ: 0.3000 mg | Freq: Once | INTRAMUSCULAR | 1 refills | Status: AC

## 2020-02-26 NOTE — Patient Instructions (Signed)
-   continue avoidance measures for grass pollen, weed pollen, molds, cat and dog - continue Allegra 180mg  daily - start Singulair 10mg  daily at bedtime.  This is a leukotriene blocker that works alongside antihistamine for improved symptom control.  If you notice any change in mood/behavior/sleep after starting Singulair then stop this medication and let us know.  Symptoms resolve after stopping the medication.   - for nasal congestion use of Fluticasone 2 sprays each nostril daily if needed.  Use for 1-2 weeks at a time before stopping once symptoms resolve.   - for nasal drainage use Astelin 2 sprays each nostril twice a day as needed - for episodic nasal drainage (ie with mask wear) can use nasal Atrovent which is 2 sprays each nostril up to 3-4 times a day if needed.   - allergen immunotherapy protocol, benefits and risk has been discussed. Consent signed today.  Will prescribe Epipen to have on days of injections.  You can schedule a new start injection at check out  - select food allergy skin testing was negative to chicken and egg.  Thus likely food intolerance and not IgE mediated food allergy.   Would however continue to avoid these foods to prevent onset of GI symptoms.    Follow-up 4 months or sooner if needed

## 2020-02-26 NOTE — Progress Notes (Signed)
Follow-up Note  RE: Crystal Conner MRN: [id number] DOB: [birth year] Date of Office Visit: 02/26/2020   History of present illness: Crystal Conner is a 44 y.o. female presenting today for follow-up of allergic rhinitis and food intolerance.  She was last seen in the office on 07/18/2019 by myself for initial visit.  She states her allergy symptoms are about the same as they were before.  She does not want to have a another spring summer fall like she had last season.  She states she just recovered from a sinus infection about 2 weeks ago where she did take an antibiotic course.  She states in January she did have a hysterectomy and her mother came to help her with this recovery.  She states she was around her dog but now lives with her mother.  She states she has not had the dog in about 8 years or so but she would see the dog periodically about once a year or so.  This prolonged exposure potentially could have worsen her allergy symptoms and potentially triggered the sinus infection.  She still has a lot of runny nose and throat clearing especially when she is needing to use a mask.  She works from home.   She is taking Allegra daily.  She states she did start back using the fluticasone nasal spray when she had her sinus infection.  She also has Astelin that she uses as well for drainage control.  At this point in time given that she is still symptomatic she is interested in pursuing immunotherapy again. Previous testing to chicken and egg were negative.  It was recommended that she continue to avoid these foods to prevent any onset of GI symptoms.  Review of systems: Review of Systems  Constitutional: Negative.   HENT:       See HPI  Eyes: Negative.   Respiratory: Negative.   Cardiovascular: Negative.   Gastrointestinal: Negative.   Musculoskeletal: Negative.   Skin: Negative.   Neurological: Negative.     All other systems negative unless noted above in HPI  Past  medical/social/surgical/family history have been reviewed and are unchanged unless specifically indicated below.  No changes  Medication List: Current Outpatient Medications  Medication Sig Dispense Refill  . azelastine (ASTELIN) 0.1 % nasal spray Place 2 sprays into both nostrils 2 (two) times daily as needed for rhinitis. Use in each nostril as directed 30 mL 5  . desvenlafaxine (PRISTIQ) 100 MG 24 hr tablet Take 100 mg by mouth daily.    [name] dicyclomine (BENTYL) 20 MG tablet Take 20 mg by mouth 3 (three) times daily as needed for spasms.    . fexofenadine (ALLEGRA) 180 MG tablet Take 180 mg by mouth daily.    [name] levETIRAcetam (KEPPRA) 500 MG tablet Take 1 tablet (500 mg total) by mouth 2 (two) times daily. (Patient taking differently: Take 1,000-1,500 mg by mouth See admin instructions. Take 1000 mg by mouth in the morning and 1500 mg in the evening) 180 tablet 4  . topiramate (TOPAMAX) 50 MG tablet Take 150 mg by mouth 2 (two) times daily.     . valsartan-hydrochlorothiazide (DIOVAN-HCT) 160-12.5 MG tablet Take 1 tablet by mouth daily.     [name] oxyCODONE-acetaminophen (PERCOCET/ROXICET) 5-325 MG tablet Take 1-2 tablets by mouth every 4 (four) hours as needed for moderate pain or severe pain. (Patient not taking: Reported on 02/26/2020) 30 tablet 0   No current facility-administered medications for this visit.  Known medication allergies: Allergies  Allergen Reactions  . Lamictal [Lamotrigine] Rash  . Penicillins Hives    Did it involve swelling of the face/tongue/throat, SOB, or low BP? No Did it involve sudden or severe rash/hives, skin peeling, or any reaction on the inside of your mouth or nose? No Did you need to seek medical attention at a hospital or doctor's office? No When did it last happen?10+ years ago If all above answers are "NO", may proceed with cephalosporin use.      Physical examination: Blood pressure 110/72, pulse 78, temperature (!) 97.1 F (36.2 C),  temperature source Temporal, weight 290 lb 3.2 oz (131.6 kg), SpO2 98 %.  General: Alert, interactive, in no acute distress. HEENT: PERRLA, TMs pearly gray, turbinates moderately edematous with clear discharge, post-pharynx non erythematous. Neck: Supple without lymphadenopathy. Lungs: Clear to auscultation without wheezing, rhonchi or rales. {no increased work of breathing. CV: Normal S1, S2 without murmurs. Abdomen: Nondistended, nontender. Skin: Warm and dry, without lesions or rashes. Extremities:  No clubbing, cyanosis or edema. Neuro:   Grossly intact.  Diagnositics/Labs: None today  Assessment and plan:  Allergic rhinitis - continue avoidance measures for grass pollen, weed pollen, molds, cat and dog - continue Allegra 180mg  daily - start Singulair 10mg  daily at bedtime.  This is a leukotriene blocker that works alongside antihistamine for improved symptom control.  If you notice any change in mood/behavior/sleep after starting Singulair then stop this medication and let us know.  Symptoms resolve after stopping the medication.   - for nasal congestion use of Fluticasone 2 sprays each nostril daily if needed.  Use for 1-2 weeks at a time before stopping once symptoms resolve.   - for nasal drainage use Astelin 2 sprays each nostril twice a day as needed - for episodic nasal drainage (ie with mask wear) can use nasal Atrovent which is 2 sprays each nostril up to 3-4 times a day if needed.   - allergen immunotherapy protocol, benefits and risk has been discussed. Consent signed today.  Will prescribe Epipen to have on days of injections.  You can schedule a new start injection at check out  Food Intolerance - select food allergy skin testing was negative to chicken and egg.  Thus likely food intolerance and not IgE mediated food allergy.   Would however continue to avoid these foods to prevent onset of GI symptoms.    Follow-up 4 months or sooner if needed   I appreciate the  opportunity to take part in Exa's care. Please do not hesitate to contact me with questions.  Sincerely,   Prudy Feeler, MD Allergy/Immunology [facility]

## 2020-03-29 ENCOUNTER — Other Ambulatory Visit: Payer: Self-pay | Admitting: *Deleted

## 2020-03-29 MED ORDER — EPINEPHRINE 0.3 MG/0.3ML IJ SOAJ: 0.3000 mg | Freq: Once | INTRAMUSCULAR | 1 refills | Status: AC

## 2020-03-30 DIAGNOSIS — J3081 Allergic rhinitis due to animal (cat) (dog) hair and dander: Secondary | ICD-10-CM

## 2020-03-30 NOTE — Addendum Note (Signed)
Addended by: Theresia Lo on: 03/30/2020 08:49 AM   Modules accepted: Orders

## 2020-03-30 NOTE — Progress Notes (Signed)
VIALS EXP 03-30-21

## 2020-03-31 DIAGNOSIS — J302 Other seasonal allergic rhinitis: Secondary | ICD-10-CM

## 2020-04-16 ENCOUNTER — Ambulatory Visit (INDEPENDENT_AMBULATORY_CARE_PROVIDER_SITE_OTHER): Payer: BC Managed Care – PPO

## 2020-04-16 ENCOUNTER — Other Ambulatory Visit: Payer: Self-pay

## 2020-04-16 DIAGNOSIS — J3089 Other allergic rhinitis: Secondary | ICD-10-CM | POA: Diagnosis not present

## 2020-04-16 NOTE — Progress Notes (Signed)
Immunotherapy   Patient Details  Name: ELLANORA TANIS MRN: [id number] Date of Birth: 07/11/1976  04/16/2020  Thersa Salt started injections for  MOLD & POLLEN-PET Following schedule: B  Frequency:1 time per week Epi-Pen:Epi-Pen Available   Patient waited 30 minutes in office post injection. She had no local or systemic reactions.  Consent signed and patient instructions given.   Rosalio Loud 04/16/2020, 1:26 PM

## 2020-04-20 ENCOUNTER — Ambulatory Visit: Payer: Self-pay

## 2020-04-20 ENCOUNTER — Telehealth: Payer: Self-pay

## 2020-04-20 ENCOUNTER — Ambulatory Visit (INDEPENDENT_AMBULATORY_CARE_PROVIDER_SITE_OTHER): Payer: BC Managed Care – PPO

## 2020-04-20 DIAGNOSIS — J3089 Other allergic rhinitis: Secondary | ICD-10-CM

## 2020-04-20 NOTE — Telephone Encounter (Signed)
Patient received her MOLD 0.1cc today. She did not receive her POLLEN-PET. Patient started her injections 04-16-20. No issues post injection. She says that she was feeling very tired last night and then while in the shower she had a non-convulsive seizure. She says that her seizures are generally controlled. She did have one 2-3 days following her 2nd COVID vaccine. Per Dr. Gillermina Hu instructions I did not proceed with the second injection and patient waited 30 minutes in the office.  She was okay upon leaving the office. I stressed the importance of following up with her neurologist given that she has had two seizures recently despite hers normally being controlled. Patient did verbalize understanding. I did request a call back tomorrow with an update.

## 2020-04-21 NOTE — Telephone Encounter (Signed)
Patient did call back. She is feeling okay today and is having no issues. She has contacted her neurologist's office for further consultation. She has also been informed of future injections and the additional question regarding her seizures. I did let her know that she should contact our office on Friday or someone from our office would call her just to follow up.

## 2020-04-21 NOTE — Telephone Encounter (Signed)
I have updated her immunotherapy flow sheet to reflect the requested changes as well as the question about seizures. I also left a message for the patient requesting a call back to see how she did through the evening hours.

## 2020-04-21 NOTE — Telephone Encounter (Signed)
Ok. Let's do 0.05cc for next week's shot and please put her on the slow advancement schedule if not on already.  Will need to make sure we are asking when her last seizure was prior to giving her injections along with the other screening questions.  If she reports having a seizure day of her injection would not give and let her know this upfront.  She needs to come for shots when she is doing well and has been seizure free.  Agree with having her follow-up with her neurologist to determine if anything else needs to be adjusted with her control.

## 2020-08-21 ENCOUNTER — Other Ambulatory Visit: Payer: Self-pay | Admitting: Allergy

## 2020-12-29 ENCOUNTER — Encounter: Payer: Self-pay | Admitting: Family Medicine

## 2020-12-29 NOTE — Telephone Encounter (Signed)
error 

## 2021-01-25 ENCOUNTER — Other Ambulatory Visit: Payer: Self-pay

## 2021-01-25 ENCOUNTER — Ambulatory Visit (HOSPITAL_BASED_OUTPATIENT_CLINIC_OR_DEPARTMENT_OTHER): Payer: BC Managed Care – PPO | Attending: Orthopedic Surgery | Admitting: Physical Therapy

## 2021-01-25 DIAGNOSIS — R262 Difficulty in walking, not elsewhere classified: Secondary | ICD-10-CM | POA: Insufficient documentation

## 2021-01-25 DIAGNOSIS — R2689 Other abnormalities of gait and mobility: Secondary | ICD-10-CM | POA: Insufficient documentation

## 2021-01-25 DIAGNOSIS — M25672 Stiffness of left ankle, not elsewhere classified: Secondary | ICD-10-CM | POA: Insufficient documentation

## 2021-01-25 DIAGNOSIS — M6281 Muscle weakness (generalized): Secondary | ICD-10-CM | POA: Insufficient documentation

## 2021-01-26 NOTE — Patient Instructions (Signed)
Access Code: [email or web address] URL: [email or web address] Date: 01/26/2021 Prepared by: Crystal Conner Crystal Conner  Exercises Towel Scrunches - 1 x daily - 7 x weekly - 3 sets - 10 reps Ankle Inversion Eversion Towel Slide - 1 x daily - 7 x weekly - 3 sets - 10 reps Seated Heel Slide - 1 x daily - 7 x weekly - 1 sets - 10 reps - 5 sec hold Seated Heel Raise - 1 x daily - 7 x weekly - 3 sets - 10 reps Small Range Straight Leg Raise - 1 x daily - 7 x weekly - 2 sets - 10 reps
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
## 2021-01-27 ENCOUNTER — Telehealth (HOSPITAL_BASED_OUTPATIENT_CLINIC_OR_DEPARTMENT_OTHER): Payer: Self-pay | Admitting: Physical Therapy

## 2021-01-27 NOTE — Telephone Encounter (Signed)
Good morning Dr. Caprice Red,  I recently evaluated your patient Crystal Conner (16-Aug-1976). There were a few questions she was not sure of so I wanted to clarify them with you. [name] states she is cleared to be full weightbearing; however, is she only full weight bearing while wearing her boot or am I safe to start weaning her off the boot as well? If she is to remain in the boot, for how long?  Thank you so much for your referral.  Sincerely, April Nonato PT, [place] [place], [place] [address] Phone: [phone] Fax: [phone]

## 2021-02-01 ENCOUNTER — Ambulatory Visit (HOSPITAL_BASED_OUTPATIENT_CLINIC_OR_DEPARTMENT_OTHER): Payer: BC Managed Care – PPO | Attending: Orthopedic Surgery | Admitting: Physical Therapy

## 2021-02-01 ENCOUNTER — Other Ambulatory Visit: Payer: Self-pay

## 2021-02-01 DIAGNOSIS — R262 Difficulty in walking, not elsewhere classified: Secondary | ICD-10-CM | POA: Diagnosis present

## 2021-02-01 DIAGNOSIS — R2689 Other abnormalities of gait and mobility: Secondary | ICD-10-CM | POA: Insufficient documentation

## 2021-02-01 DIAGNOSIS — M25672 Stiffness of left ankle, not elsewhere classified: Secondary | ICD-10-CM | POA: Diagnosis present

## 2021-02-01 DIAGNOSIS — M6281 Muscle weakness (generalized): Secondary | ICD-10-CM | POA: Insufficient documentation

## 2021-02-01 NOTE — Therapy (Signed)
[place], [place], [address] Phone: [phone]   Fax:  [phone]  Physical Therapy Treatment  Patient Details  Name: Crystal Conner MRN: [id number] Date of Birth: 10-Jan-1976 Referring Provider (PT): Rayburn Felt, MD   Encounter Date: 02/01/2021   PT End of Session - 02/01/21 1710    Visit Number 2    Number of Visits 25    Date for PT Re-Evaluation 04/20/21    Authorization Type BCBS    PT Start Time 1610    PT Stop Time 1700    PT Time Calculation (min) 50 min    Activity Tolerance Patient tolerated treatment well    Behavior During Therapy [facility] for tasks assessed/performed           Past Medical History:  Diagnosis Date  . Anemia   . Arthritis   . Depression   . Fibromyalgia   . Gallstones   . Hypertension   . Seizures ([place])     Past Surgical History:  Procedure Laterality Date  . ABDOMINAL HYSTERECTOMY Bilateral 12/15/2019   Procedure: HYSTERECTOMY ABDOMINAL with bilateral salpingectomy;  Surgeon: Louretta Shorten, MD;  Location: [place];  Service: Gynecology;  Laterality: Bilateral;  . ABDOMINOPLASTY    . ADENOIDECTOMY    . CHOLECYSTECTOMY  10/30/2012   Procedure: LAPAROSCOPIC CHOLECYSTECTOMY WITH INTRAOPERATIVE CHOLANGIOGRAM;  Surgeon: Merrie Roof, MD;  Location: [facility];  Service: General;  Laterality: N/A;  . GASTRIC BYPASS  2001  . KNEE SURGERY  1993   left knee  . TONSILECTOMY, ADENOIDECTOMY, BILATERAL MYRINGOTOMY AND TUBES    . TONSILLECTOMY    . TYMPANOSTOMY TUBE PLACEMENT      There were no vitals filed for this visit.   Subjective Assessment - 02/01/21 1708    Subjective Pt states she has been walking in the house without the boot or a/d.    Patient is accompained by: Family member    How long can you sit comfortably? n/a    How long can you stand comfortably? Recently cleared for full weightbearing    How long can you walk comfortably? Recently cleared for full  weightbearing    Patient Stated Goals Pt would like to be able to safely ascend/descend steps to her room.    Currently in Pain? No/denies                             [facility] Adult PT Treatment/Exercise - 02/01/21 0001      Ambulation/Gait   Stairs Yes    Stairs Assistance 4: Min guard    Stair Management Technique One rail Right;Step to pattern;With crutches    Number of Stairs 12    Height of Stairs 6      Exercises   Exercises Knee/Hip      Knee/Hip Exercises: Supine   Quad Sets Strengthening;2 sets;10 reps;Left    Short Arc Quad Sets Strengthening;Left;2 sets;10 reps      Manual Therapy   Manual Therapy Compression Bandaging;Joint mobilization    Joint Mobilization Talocrural distraction and PA mobs for dorsiflexion    Compression Bandaging ACE wrap      Ankle Exercises: Seated   Towel Crunch 5 reps    Towel Inversion/Eversion 5 reps      Ankle Exercises: Supine   Other Supine Ankle Exercises 4-way ankle orange tband. 2x10  PT Short Term Goals - 01/26/21 1139      PT SHORT TERM GOAL #1   Title Pt will be independent with her HEP    Time 6    Period Weeks    Status New    Target Date 03/09/21      PT SHORT TERM GOAL #2   Title Pt will be able to amb at least 150' with reciprocal gait pattern using RW for home mobility    Baseline Antalgic step to gait pattern currently ~50' with RW    Time 6    Period Weeks    Status New    Target Date 03/09/21      PT SHORT TERM GOAL #3   Title Pt will be have L ankle DF ROM to at least neutral for standing tasks    Baseline 15 deg from neutral    Time 6    Period Weeks    Status New    Target Date 03/09/21      PT SHORT TERM GOAL #4   Title Pt will be able to safely ascend/descend 12 steps mod I to get to her upstairs bedroom    Baseline Pt has not attempted yet    Time 6    Period Weeks    Status New    Target Date 03/09/21             PT Long Term Goals -  01/26/21 1142      PT LONG TERM GOAL #1   Title Pt will be independent with final HEP    Time 12    Period Weeks    Status New    Target Date 04/20/21      PT LONG TERM GOAL #2   Title Pt will be able to amb without a/d at least 400' for community mobility    Time 12    Period Weeks    Status New    Target Date 04/20/21      PT LONG TERM GOAL #3   Title Pt will be able to perform 10 full squats to demo functional LE strength    Time 12    Period Weeks    Status New    Target Date 04/20/21      PT LONG TERM GOAL #4   Title Pt will be able to ascend/descend steps independently with reciprocal gait pattern for home mobility    Time 12    Period Weeks    Status New    Target Date 04/20/21                 Plan - 02/01/21 1708    Clinical Impression Statement Treatment session focused on improving ankle ROM, educating on ACE wrapping for edema, and increasing ankle and knee strength. Pt with improving quad contraction and much less edema. Pt able to demo safe stair ascent/descent with single crutch. Pt can likely progress to crutches for mobility.    Personal Factors and Comorbidities Age;Time since onset of injury/illness/exacerbation;Comorbidity 1    Comorbidities Pt with large lateral/posterior L thigh wound and L medial malleolus wound (seeing [place] wound care)    Examination-Activity Limitations Bathing;Bend;Caring for Others;Carry;Locomotion Level;Stairs;Squat;Stand;Transfers    Examination-Participation Restrictions Occupation;Cleaning;Community Activity;Driving;Yard Work    Rehab Potential Good    PT Frequency 2x / week    PT Duration 12 weeks    PT Treatment/Interventions ADLs/Self Care Home Management;Cryotherapy;Electrical Stimulation;Iontophoresis 4mg /ml Dexamethasone;Moist Heat;Ultrasound;DME Instruction;Gait training;Stair training;Functional mobility training;Therapeutic activities;Therapeutic exercise;Balance training;Neuromuscular  re-education;Patient/family  education;Manual techniques;Dry needling;Passive range of motion;Taping;Vasopneumatic Device;Joint Manipulations;Scar mobilization    PT Next Visit Plan Assess response to HEP. Manual therapy and exercises for improved ankle ROM. Continue strengthening L LE. Crutch training as needed. Follow-up on elbow.    PT Home Exercise Plan Access Code [email or web address]    Consulted and Agree with Plan of Care Patient;Family member/caregiver    Family Member Consulted Mother           Patient will benefit from skilled therapeutic intervention in order to improve the following deficits and impairments:  Decreased balance,Decreased endurance,Decreased mobility,Difficulty walking,Hypomobility  Visit Diagnosis: Difficulty in walking, not elsewhere classified  Stiffness of left ankle, not elsewhere classified  Muscle weakness (generalized)  Other abnormalities of gait and mobility     Problem List Patient Active Problem List   Diagnosis Date Noted  . S/P TAH (total abdominal hysterectomy) 12/15/2019  . Chronic migraine 09/24/2017  . Confusion 07/23/2017  . Cholelithiasis 09/19/2012  . History of gastric bypass 09/19/2012    [facility] April Ma L Suhayla Chisom PT, DPT 02/01/2021, 5:12 PM  [facility] [place], [place], [address] Phone: [phone]   Fax:  [phone]  Name: Crystal Conner MRN: [id number] Date of Birth: [birth year]

## 2021-02-04 ENCOUNTER — Ambulatory Visit (HOSPITAL_BASED_OUTPATIENT_CLINIC_OR_DEPARTMENT_OTHER): Payer: BC Managed Care – PPO | Admitting: Physical Therapy

## 2021-02-04 ENCOUNTER — Other Ambulatory Visit: Payer: Self-pay

## 2021-02-04 DIAGNOSIS — R2689 Other abnormalities of gait and mobility: Secondary | ICD-10-CM

## 2021-02-04 DIAGNOSIS — M25672 Stiffness of left ankle, not elsewhere classified: Secondary | ICD-10-CM

## 2021-02-04 DIAGNOSIS — R262 Difficulty in walking, not elsewhere classified: Secondary | ICD-10-CM | POA: Diagnosis not present

## 2021-02-04 DIAGNOSIS — M6281 Muscle weakness (generalized): Secondary | ICD-10-CM

## 2021-02-04 NOTE — Therapy (Signed)
[place], [place], [address] Phone: [phone]   Fax:  [phone]  Physical Therapy Treatment  Patient Details  Name: ALYN RIEDINGER MRN: [id number] Date of Birth: 28-Feb-1976 Referring Provider (PT): Rayburn Felt, MD   Encounter Date: 02/04/2021   PT End of Session - 02/04/21 1601    Visit Number 3    Number of Visits 25    Date for PT Re-Evaluation 04/20/21    Authorization Type BCBS    PT Start Time 1515    PT Stop Time 1600    PT Time Calculation (min) 45 min    Activity Tolerance Patient tolerated treatment well    Behavior During Therapy [facility] for tasks assessed/performed           Past Medical History:  Diagnosis Date  . Anemia   . Arthritis   . Depression   . Fibromyalgia   . Gallstones   . Hypertension   . Seizures ([place])     Past Surgical History:  Procedure Laterality Date  . ABDOMINAL HYSTERECTOMY Bilateral 12/15/2019   Procedure: HYSTERECTOMY ABDOMINAL with bilateral salpingectomy;  Surgeon: Louretta Shorten, MD;  Location: [place];  Service: Gynecology;  Laterality: Bilateral;  . ABDOMINOPLASTY    . ADENOIDECTOMY    . CHOLECYSTECTOMY  10/30/2012   Procedure: LAPAROSCOPIC CHOLECYSTECTOMY WITH INTRAOPERATIVE CHOLANGIOGRAM;  Surgeon: Merrie Roof, MD;  Location: [facility];  Service: General;  Laterality: N/A;  . GASTRIC BYPASS  2001  . KNEE SURGERY  1993   left knee  . TONSILECTOMY, ADENOIDECTOMY, BILATERAL MYRINGOTOMY AND TUBES    . TONSILLECTOMY    . TYMPANOSTOMY TUBE PLACEMENT      There were no vitals filed for this visit.   Subjective Assessment - 02/04/21 1520    Subjective Pt has been able to use crutches to get up/down her steps at home without any issue. Pt has been cleared to wean out of her boot.    Patient is accompained by: Family member    How long can you sit comfortably? n/a    How long can you stand comfortably? Recently cleared for full weightbearing    How  long can you walk comfortably? Recently cleared for full weightbearing    Patient Stated Goals Pt would like to be able to safely ascend/descend steps to her room.    Currently in Pain? No/denies                             [facility] Adult PT Treatment/Exercise - 02/04/21 0001      Knee/Hip Exercises: Stretches   Quad Stretch Left;3 reps;10 seconds    Quad Stretch Limitations PNF    Gastroc Stretch Left;30 seconds    Soleus Stretch Left;30 seconds      Knee/Hip Exercises: Aerobic   Other Aerobic Sci-fit 2 min forward, 2 min backward L1      Knee/Hip Exercises: Machines for Strengthening   Other Machine Shuttle DL 50# x10; L LE 25# x10      Knee/Hip Exercises: Supine   Quad Sets Strengthening;2 sets;10 reps;Left    Straight Leg Raises Strengthening;Left;2 sets;10 reps    Straight Leg Raises Limitations Short range      Knee/Hip Exercises: Prone   Hamstring Curl 2 sets;10 reps      Manual Therapy   Joint Mobilization Talocrural distraction and PA mobs for dorsiflexion; calcaneus mobilization for inversion/eversion  Ankle Exercises: Supine   Other Supine Ankle Exercises AROM ankle x10                    PT Short Term Goals - 01/26/21 1139      PT SHORT TERM GOAL #1   Title Pt will be independent with her HEP    Time 6    Period Weeks    Status New    Target Date 03/09/21      PT SHORT TERM GOAL #2   Title Pt will be able to amb at least 150' with reciprocal gait pattern using RW for home mobility    Baseline Antalgic step to gait pattern currently ~50' with RW    Time 6    Period Weeks    Status New    Target Date 03/09/21      PT SHORT TERM GOAL #3   Title Pt will be have L ankle DF ROM to at least neutral for standing tasks    Baseline 15 deg from neutral    Time 6    Period Weeks    Status New    Target Date 03/09/21      PT SHORT TERM GOAL #4   Title Pt will be able to safely ascend/descend 12 steps mod I to get to her  upstairs bedroom    Baseline Pt has not attempted yet    Time 6    Period Weeks    Status New    Target Date 03/09/21             PT Long Term Goals - 01/26/21 1142      PT LONG TERM GOAL #1   Title Pt will be independent with final HEP    Time 12    Period Weeks    Status New    Target Date 04/20/21      PT LONG TERM GOAL #2   Title Pt will be able to amb without a/d at least 400' for community mobility    Time 12    Period Weeks    Status New    Target Date 04/20/21      PT LONG TERM GOAL #3   Title Pt will be able to perform 10 full squats to demo functional LE strength    Time 12    Period Weeks    Status New    Target Date 04/20/21      PT LONG TERM GOAL #4   Title Pt will be able to ascend/descend steps independently with reciprocal gait pattern for home mobility    Time 12    Period Weeks    Status New    Target Date 04/20/21                 Plan - 02/04/21 1706    Clinical Impression Statement Treatment session focused on improving quad muscle strength and weaning pt off her boot with improved gait mechanics. Pt with limited L knee and ankle ROM; provided manual therapy and stretching. Pt able to demo step through gait pattern with crutches and no boot; however, L knee with poor stability.    Personal Factors and Comorbidities Age;Time since onset of injury/illness/exacerbation;Comorbidity 1    Comorbidities Pt with large lateral/posterior L thigh wound and L medial malleolus wound (seeing [place] wound care)    Examination-Activity Limitations Bathing;Bend;Caring for Others;Carry;Locomotion Level;Stairs;Squat;Stand;Transfers    Examination-Participation Restrictions Occupation;Cleaning;Community Activity;Driving;Yard Work    Designer, multimedia  PT Frequency 2x / week    PT Duration 12 weeks    PT Treatment/Interventions ADLs/Self Care Home Management;Cryotherapy;Electrical Stimulation;Iontophoresis 4mg /ml Dexamethasone;Moist Heat;Ultrasound;DME  Instruction;Gait training;Stair training;Functional mobility training;Therapeutic activities;Therapeutic exercise;Balance training;Neuromuscular re-education;Patient/family education;Manual techniques;Dry needling;Passive range of motion;Taping;Vasopneumatic Device;Joint Manipulations;Scar mobilization    PT Next Visit Plan Assess response to HEP. Manual therapy and exercises for improved ankle ROM. Continue strengthening L LE. Crutch training as needed without boot. Begin to improve elbow ROM and strength.    PT Home Exercise Plan Access Code [email or web address]    Consulted and Agree with Plan of Care Patient;Family member/caregiver    Family Member Consulted Mother           Patient will benefit from skilled therapeutic intervention in order to improve the following deficits and impairments:  Decreased balance,Decreased endurance,Decreased mobility,Difficulty walking,Hypomobility  Visit Diagnosis: Difficulty in walking, not elsewhere classified  Stiffness of left ankle, not elsewhere classified  Muscle weakness (generalized)  Other abnormalities of gait and mobility     Problem List Patient Active Problem List   Diagnosis Date Noted  . S/P TAH (total abdominal hysterectomy) 12/15/2019  . Chronic migraine 09/24/2017  . Confusion 07/23/2017  . Cholelithiasis 09/19/2012  . History of gastric bypass 09/19/2012    [facility] April Ma L Kambrie Eddleman PT, DPT 02/04/2021, 5:11 PM  [facility] [place], [place], [address] Phone: [phone]   Fax:  [phone]  Name: ZAELYN NOACK MRN: [id number] Date of Birth: [birth year]

## 2021-02-08 ENCOUNTER — Other Ambulatory Visit: Payer: Self-pay

## 2021-02-08 ENCOUNTER — Ambulatory Visit (HOSPITAL_BASED_OUTPATIENT_CLINIC_OR_DEPARTMENT_OTHER): Payer: BC Managed Care – PPO | Admitting: Physical Therapy

## 2021-02-08 ENCOUNTER — Encounter (HOSPITAL_BASED_OUTPATIENT_CLINIC_OR_DEPARTMENT_OTHER): Payer: Self-pay | Admitting: Physical Therapy

## 2021-02-08 DIAGNOSIS — R262 Difficulty in walking, not elsewhere classified: Secondary | ICD-10-CM | POA: Diagnosis not present

## 2021-02-08 DIAGNOSIS — M25672 Stiffness of left ankle, not elsewhere classified: Secondary | ICD-10-CM

## 2021-02-08 DIAGNOSIS — M6281 Muscle weakness (generalized): Secondary | ICD-10-CM

## 2021-02-08 DIAGNOSIS — R2689 Other abnormalities of gait and mobility: Secondary | ICD-10-CM

## 2021-02-08 NOTE — Therapy (Signed)
[place], [place], [address] Phone: [phone]   Fax:  [phone]  Physical Therapy Treatment  Patient Details  Name: SARAH BAEZ MRN: [id number] Date of Birth: [birth year] Referring Provider (PT): Rayburn Felt, MD   Encounter Date: 02/08/2021   PT End of Session - 02/08/21 1604    Visit Number 4    Number of Visits 25    Date for PT Re-Evaluation 04/20/21    Authorization Type BCBS    PT Start Time 1602    PT Stop Time 1645    PT Time Calculation (min) 43 min    Activity Tolerance Patient tolerated treatment well    Behavior During Therapy [facility] for tasks assessed/performed           Past Medical History:  Diagnosis Date  . Anemia   . Arthritis   . Depression   . Fibromyalgia   . Gallstones   . Hypertension   . Seizures ([place])     Past Surgical History:  Procedure Laterality Date  . ABDOMINAL HYSTERECTOMY Bilateral 12/15/2019   Procedure: HYSTERECTOMY ABDOMINAL with bilateral salpingectomy;  Surgeon: Louretta Shorten, MD;  Location: [place];  Service: Gynecology;  Laterality: Bilateral;  . ABDOMINOPLASTY    . ADENOIDECTOMY    . CHOLECYSTECTOMY  10/30/2012   Procedure: LAPAROSCOPIC CHOLECYSTECTOMY WITH INTRAOPERATIVE CHOLANGIOGRAM;  Surgeon: Merrie Roof, MD;  Location[facility];  Service: General;  Laterality: N/A;  . GASTRIC BYPASS  2001  . KNEE SURGERY  1993   left knee  . TONSILECTOMY, ADENOIDECTOMY, BILATERAL MYRINGOTOMY AND TUBES    . TONSILLECTOMY    . TYMPANOSTOMY TUBE PLACEMENT      There were no vitals filed for this visit.       [facility] PT Assessment - 02/08/21 0001      AROM   Left Elbow Flexion 105   115   Left Elbow Extension -15   -10 by end of session                        [facility] Adult PT Treatment/Exercise - 02/08/21 0001      Ambulation/Gait   Ambulation Distance (Feet) 150 Feet    Assistive device R Axillary Crutch    Gait Pattern  Step-through pattern;Decreased weight shift to left;Lateral trunk lean to right;Trunk flexed    Gait Comments Session focused on decreasing trunk flexion with stance phase and improving stance time; pt with mild R lateral lean      Exercises   Exercises Elbow      Elbow Exercises   Elbow Flexion AROM;Left;10 reps;Supine    Elbow Extension AROM;10 reps;Supine;Left    Bar Weights/Barbell (Elbow Extension) 1 lb    Forearm Supination Left;Supine   2x 1 min hold with elbow extended     Knee/Hip Exercises: Aerobic   Other Aerobic Sci-fit x5 min working on knee ROM      Manual Therapy   Joint Mobilization Humeroulner distraction 3x10 sec; humeroradial distraction 3x10 sec; distal ulner and humeral PA/AP glides for pronation/supination; humeroulner flexion 3x10 sec                    PT Short Term Goals - 01/26/21 1139      PT SHORT TERM GOAL #1   Title Pt will be independent with her HEP    Time 6    Period Weeks    Status New    Target  Date 03/09/21      PT SHORT TERM GOAL #2   Title Pt will be able to amb at least 150' with reciprocal gait pattern using RW for home mobility    Baseline Antalgic step to gait pattern currently ~50' with RW    Time 6    Period Weeks    Status New    Target Date 03/09/21      PT SHORT TERM GOAL #3   Title Pt will be have L ankle DF ROM to at least neutral for standing tasks    Baseline 15 deg from neutral    Time 6    Period Weeks    Status New    Target Date 03/09/21      PT SHORT TERM GOAL #4   Title Pt will be able to safely ascend/descend 12 steps mod I to get to her upstairs bedroom    Baseline Pt has not attempted yet    Time 6    Period Weeks    Status New    Target Date 03/09/21             PT Long Term Goals - 01/26/21 1142      PT LONG TERM GOAL #1   Title Pt will be independent with final HEP    Time 12    Period Weeks    Status New    Target Date 04/20/21      PT LONG TERM GOAL #2   Title Pt will be  able to amb without a/d at least 400' for community mobility    Time 12    Period Weeks    Status New    Target Date 04/20/21      PT LONG TERM GOAL #3   Title Pt will be able to perform 10 full squats to demo functional LE strength    Time 12    Period Weeks    Status New    Target Date 04/20/21      PT LONG TERM GOAL #4   Title Pt will be able to ascend/descend steps independently with reciprocal gait pattern for home mobility    Time 12    Period Weeks    Status New    Target Date 04/20/21                 Plan - 02/08/21 1658    Clinical Impression Statement Treatment session focused on continuing to improve gait. Weaned pt on to 1 crutch and encouraged to decrease trunk flexion. Not enough time to work on pt's knee ROM this session. PT able to finally assess pt's elbow. Pt tends to pronate elbow. ROM limited. Worked on improving elbow ROM with manual therapy and elbow AROM and strengthening. Provided new exercises to address pt's elbow.    Personal Factors and Comorbidities Age;Time since onset of injury/illness/exacerbation;Comorbidity 1    Comorbidities Pt with large lateral/posterior L thigh wound and L medial malleolus wound (seeing [place] wound care)    Examination-Activity Limitations Bathing;Bend;Caring for Others;Carry;Locomotion Level;Stairs;Squat;Stand;Transfers    Examination-Participation Restrictions Occupation;Cleaning;Community Activity;Driving;Yard Work    Rehab Potential Good    PT Frequency 2x / week    PT Duration 12 weeks    PT Treatment/Interventions ADLs/Self Care Home Management;Cryotherapy;Electrical Stimulation;Iontophoresis 4mg /ml Dexamethasone;Moist Heat;Ultrasound;DME Instruction;Gait training;Stair training;Functional mobility training;Therapeutic activities;Therapeutic exercise;Balance training;Neuromuscular re-education;Patient/family education;Manual techniques;Dry needling;Passive range of motion;Taping;Vasopneumatic Device;Joint  Manipulations;Scar mobilization    PT Next Visit Plan Assess response to HEP. Manual therapy and exercises for  improved ankle and elbow ROM. Continue strengthening L LE and L elbow. Work on knee flexion!!!    PT Home Exercise Plan Access Code [place] and Agree with Plan of Care Patient;Family member/caregiver    Family Member Consulted Mother           Patient will benefit from skilled therapeutic intervention in order to improve the following deficits and impairments:  Decreased balance,Decreased endurance,Decreased mobility,Difficulty walking,Hypomobility  Visit Diagnosis: Difficulty in walking, not elsewhere classified  Stiffness of left ankle, not elsewhere classified  Muscle weakness (generalized)  Other abnormalities of gait and mobility     Problem List Patient Active Problem List   Diagnosis Date Noted  . S/P TAH (total abdominal hysterectomy) 12/15/2019  . Chronic migraine 09/24/2017  . Confusion 07/23/2017  . Cholelithiasis 09/19/2012  . History of gastric bypass 09/19/2012    [facility] April Ma L Avera PT, DPT 02/08/2021, 5:08 PM  [facility] [place], [place], [address] Phone: [phone]   Fax:  [phone]  Name: ASTIN RAPE MRN: [id number] Date of Birth: October 01, 1976

## 2021-02-11 ENCOUNTER — Other Ambulatory Visit: Payer: Self-pay

## 2021-02-11 ENCOUNTER — Ambulatory Visit (HOSPITAL_BASED_OUTPATIENT_CLINIC_OR_DEPARTMENT_OTHER): Payer: BC Managed Care – PPO | Admitting: Physical Therapy

## 2021-02-11 DIAGNOSIS — R2689 Other abnormalities of gait and mobility: Secondary | ICD-10-CM

## 2021-02-11 DIAGNOSIS — R262 Difficulty in walking, not elsewhere classified: Secondary | ICD-10-CM

## 2021-02-11 DIAGNOSIS — M6281 Muscle weakness (generalized): Secondary | ICD-10-CM

## 2021-02-11 DIAGNOSIS — M25672 Stiffness of left ankle, not elsewhere classified: Secondary | ICD-10-CM

## 2021-02-13 NOTE — Therapy (Signed)
[place], [place], [address] Phone: [phone]   Fax:  [phone]  Physical Therapy Treatment  Patient Details  Name: Crystal Conner MRN: [id number] Date of Birth: [birth year] Referring Provider (PT): Rayburn Felt, MD   Encounter Date: 02/11/2021    Past Medical History:  Diagnosis Date  . Anemia   . Arthritis   . Depression   . Fibromyalgia   . Gallstones   . Hypertension   . Seizures ([place])     Past Surgical History:  Procedure Laterality Date  . ABDOMINAL HYSTERECTOMY Bilateral 12/15/2019   Procedure: HYSTERECTOMY ABDOMINAL with bilateral salpingectomy;  Surgeon: Louretta Shorten, MD;  Location: [place];  Service: Gynecology;  Laterality: Bilateral;  . ABDOMINOPLASTY    . ADENOIDECTOMY    . CHOLECYSTECTOMY  10/30/2012   Procedure: LAPAROSCOPIC CHOLECYSTECTOMY WITH INTRAOPERATIVE CHOLANGIOGRAM;  Surgeon: Merrie Roof, MD;  Location: [facility];  Service: General;  Laterality: N/A;  . GASTRIC BYPASS  2001  . KNEE SURGERY  1993   left knee  . TONSILECTOMY, ADENOIDECTOMY, BILATERAL MYRINGOTOMY AND TUBES    . TONSILLECTOMY    . TYMPANOSTOMY TUBE PLACEMENT      There were no vitals filed for this visit.   Subjective Assessment - 02/13/21 2228    Subjective Pt states she has been doing her exercises but has not been doing the straight leg raise exercise.    Patient is accompained by: Family member    How long can you sit comfortably? n/a    How long can you stand comfortably? Recently cleared for full weightbearing    How long can you walk comfortably? Recently cleared for full weightbearing    Patient Stated Goals Pt would like to be able to safely ascend/descend steps to her room.            Manual therapy:  Ankle talocrural distraction 3x10 sec  Talocrural AP/PA mobs for dorsiflexion 3x10 sec  PROM ankle DF with PNF 5x5 sec holds  Tibiofemoral grade II to III mobs for flex & ext  PROM  knee flexion with PNF 5x5 sec holds  Humeroradial and humeroulner grade II to III mobs for elbow flex & ext  Ulner-radial grade II to III mobs for improving forearm supination     AROM:  Supine heel slides with band for overpressure 10 x 3 sec holds  Ankle DF/PF x10 between mobs  Elbow flexion/extension x10 between mobs   Therex:  Min A SLR x10  "skull crushers" x10 with 2#               PT Short Term Goals - 01/26/21 1139      PT SHORT TERM GOAL #1   Title Pt will be independent with her HEP    Time 6    Period Weeks    Status New    Target Date 03/09/21      PT SHORT TERM GOAL #2   Title Pt will be able to amb at least 150' with reciprocal gait pattern using RW for home mobility    Baseline Antalgic step to gait pattern currently ~50' with RW    Time 6    Period Weeks    Status New    Target Date 03/09/21      PT SHORT TERM GOAL #3   Title Pt will be have L ankle DF ROM to at least neutral for standing tasks    Baseline 15 deg from neutral  Time 6    Period Weeks    Status New    Target Date 03/09/21      PT SHORT TERM GOAL #4   Title Pt will be able to safely ascend/descend 12 steps mod I to get to her upstairs bedroom    Baseline Pt has not attempted yet    Time 6    Period Weeks    Status New    Target Date 03/09/21             PT Long Term Goals - 01/26/21 1142      PT LONG TERM GOAL #1   Title Pt will be independent with final HEP    Time 12    Period Weeks    Status New    Target Date 04/20/21      PT LONG TERM GOAL #2   Title Pt will be able to amb without a/d at least 400' for community mobility    Time 12    Period Weeks    Status New    Target Date 04/20/21      PT LONG TERM GOAL #3   Title Pt will be able to perform 10 full squats to demo functional LE strength    Time 12    Period Weeks    Status New    Target Date 04/20/21      PT LONG TERM GOAL #4   Title Pt will be able to ascend/descend steps independently  with reciprocal gait pattern for home mobility    Time 12    Period Weeks    Status New    Target Date 04/20/21                 Plan - 02/13/21 2228    Clinical Impression Statement Treatment session focused on improving L ankle, knee, and elbow ROM. Provided elbow strengthening exercise. Pt encouraged to increase ROM.    Personal Factors and Comorbidities Age;Time since onset of injury/illness/exacerbation;Comorbidity 1    Comorbidities Pt with large lateral/posterior L thigh wound and L medial malleolus wound (seeing [facility] care)    Examination-Activity Limitations Bathing;Bend;Caring for Others;Carry;Locomotion Level;Stairs;Squat;Stand;Transfers    Examination-Participation Restrictions Occupation;Cleaning;Community Activity;Driving;Yard Work    Rehab Potential Good    PT Frequency 2x / week    PT Duration 12 weeks    PT Treatment/Interventions ADLs/Self Care Home Management;Cryotherapy;Electrical Stimulation;Iontophoresis 4mg /ml Dexamethasone;Moist Heat;Ultrasound;DME Instruction;Gait training;Stair training;Functional mobility training;Therapeutic activities;Therapeutic exercise;Balance training;Neuromuscular re-education;Patient/family education;Manual techniques;Dry needling;Passive range of motion;Taping;Vasopneumatic Device;Joint Manipulations;Scar mobilization    PT Next Visit Plan Assess response to HEP. Manual therapy and exercises for improved ankle and elbow ROM. Continue strengthening L LE and L elbow. Work on knee flexion!!!    PT Home Exercise Plan Access Code [place] and Agree with Plan of Care Patient;Family member/caregiver    Family Member Consulted Mother           Patient will benefit from skilled therapeutic intervention in order to improve the following deficits and impairments:  Decreased balance,Decreased endurance,Decreased mobility,Difficulty walking,Hypomobility  Visit Diagnosis: Difficulty in walking, not elsewhere  classified  Stiffness of left ankle, not elsewhere classified  Muscle weakness (generalized)  Other abnormalities of gait and mobility     Problem List Patient Active Problem List   Diagnosis Date Noted  . S/P TAH (total abdominal hysterectomy) 12/15/2019  . Chronic migraine 09/24/2017  . Confusion 07/23/2017  . Cholelithiasis 09/19/2012  . History of gastric bypass 09/19/2012  [facility] [address] PT, DPT 02/13/2021, 10:29 PM  [facility] [address] [place], [place], [address] Phone: [phone]   Fax:  [phone]  Name: Crystal Conner MRN: [id number] Date of Birth: 07-19-1976

## 2021-02-15 ENCOUNTER — Other Ambulatory Visit: Payer: Self-pay

## 2021-02-15 ENCOUNTER — Ambulatory Visit (HOSPITAL_BASED_OUTPATIENT_CLINIC_OR_DEPARTMENT_OTHER): Payer: BC Managed Care – PPO | Admitting: Physical Therapy

## 2021-02-15 DIAGNOSIS — R262 Difficulty in walking, not elsewhere classified: Secondary | ICD-10-CM | POA: Diagnosis not present

## 2021-02-15 DIAGNOSIS — M6281 Muscle weakness (generalized): Secondary | ICD-10-CM

## 2021-02-15 DIAGNOSIS — R2689 Other abnormalities of gait and mobility: Secondary | ICD-10-CM

## 2021-02-15 DIAGNOSIS — M25672 Stiffness of left ankle, not elsewhere classified: Secondary | ICD-10-CM

## 2021-02-15 NOTE — Therapy (Signed)
[place], [place], [address] Phone: [phone]   Fax:  [phone]  Physical Therapy Treatment  Patient Details  Name: Crystal Conner MRN: [id number] Date of Birth: 1976/01/23 Referring Provider (PT): Rayburn Felt, MD   Encounter Date: 02/15/2021   PT End of Session - 02/15/21 1658    Visit Number 6    Number of Visits 25    Date for PT Re-Evaluation 04/20/21    Authorization Type BCBS    PT Start Time 1555    PT Stop Time 1645    PT Time Calculation (min) 50 min    Activity Tolerance Patient tolerated treatment well    Behavior During Therapy [facility] for tasks assessed/performed           Past Medical History:  Diagnosis Date  . Anemia   . Arthritis   . Depression   . Fibromyalgia   . Gallstones   . Hypertension   . Seizures ([place])     Past Surgical History:  Procedure Laterality Date  . ABDOMINAL HYSTERECTOMY Bilateral 12/15/2019   Procedure: HYSTERECTOMY ABDOMINAL with bilateral salpingectomy;  Surgeon: Louretta Shorten, MD;  Location: [place];  Service: Gynecology;  Laterality: Bilateral;  . ABDOMINOPLASTY    . ADENOIDECTOMY    . CHOLECYSTECTOMY  10/30/2012   Procedure: LAPAROSCOPIC CHOLECYSTECTOMY WITH INTRAOPERATIVE CHOLANGIOGRAM;  Surgeon: Merrie Roof, MD;  Location[facility];  Service: General;  Laterality: N/A;  . GASTRIC BYPASS  2001  . KNEE SURGERY  1993   left knee  . TONSILECTOMY, ADENOIDECTOMY, BILATERAL MYRINGOTOMY AND TUBES    . TONSILLECTOMY    . TYMPANOSTOMY TUBE PLACEMENT      There were no vitals filed for this visit.   Subjective Assessment - 02/15/21 1554    Subjective Pt reports the stretching exercises have been okay.    Patient is accompained by: Family member    How long can you sit comfortably? n/a    How long can you stand comfortably? Recently cleared for full weightbearing    How long can you walk comfortably? Recently cleared for full weightbearing     Patient Stated Goals Pt would like to be able to safely ascend/descend steps to her room.    Currently in Pain? No/denies                             [facility] Adult PT Treatment/Exercise - 02/15/21 0001      Elbow Exercises   Elbow Flexion AROM;Left;10 reps;Supine    Elbow Extension AROM;10 reps;Supine;Left      Knee/Hip Exercises: Stretches   Sports administrator Left;3 reps;10 seconds    Sports administrator Limitations PNF in prone    Gastroc Stretch Left;30 seconds    Soleus Stretch Left;30 seconds      Knee/Hip Exercises: Civil engineer, contracting Shuttle DL 50# x10; L LE 25# x10      Knee/Hip Exercises: Standing   Heel Raises 2 sets;10 reps    Other Standing Knee Exercises Toe raises 2x10      Knee/Hip Exercises: Supine   Short Arc Quad Sets Strengthening;Left;2 sets;10 reps    Short Arc Quad Sets Limitations 2 lb      Knee/Hip Exercises: Prone   Hamstring Curl 2 sets;10 reps    Hamstring Curl Limitations with AAROM    Hip Extension Strengthening;Right;Left;2 sets;10 reps      Manual Therapy  Joint Mobilization Humeroulner distraction 3x10 sec; humeroradial distraction 3x10 sec; distal ulner and humeral PA/AP glides for pronation/supination; humeroulner flexion 3x10 sec; tibiofemoral grade III mob for knee flexion with PNF                    PT Short Term Goals - 01/26/21 1139      PT SHORT TERM GOAL #1   Title Pt will be independent with her HEP    Time 6    Period Weeks    Status New    Target Date 03/09/21      PT SHORT TERM GOAL #2   Title Pt will be able to amb at least 150' with reciprocal gait pattern using RW for home mobility    Baseline Antalgic step to gait pattern currently ~50' with RW    Time 6    Period Weeks    Status New    Target Date 03/09/21      PT SHORT TERM GOAL #3   Title Pt will be have L ankle DF ROM to at least neutral for standing tasks    Baseline 15 deg from neutral    Time 6    Period Weeks     Status New    Target Date 03/09/21      PT SHORT TERM GOAL #4   Title Pt will be able to safely ascend/descend 12 steps mod I to get to her upstairs bedroom    Baseline Pt has not attempted yet    Time 6    Period Weeks    Status New    Target Date 03/09/21             PT Long Term Goals - 01/26/21 1142      PT LONG TERM GOAL #1   Title Pt will be independent with final HEP    Time 12    Period Weeks    Status New    Target Date 04/20/21      PT LONG TERM GOAL #2   Title Pt will be able to amb without a/d at least 400' for community mobility    Time 12    Period Weeks    Status New    Target Date 04/20/21      PT LONG TERM GOAL #3   Title Pt will be able to perform 10 full squats to demo functional LE strength    Time 12    Period Weeks    Status New    Target Date 04/20/21      PT LONG TERM GOAL #4   Title Pt will be able to ascend/descend steps independently with reciprocal gait pattern for home mobility    Time 12    Period Weeks    Status New    Target Date 04/20/21                 Plan - 02/15/21 1656    Clinical Impression Statement Treatment session focused on progressing pt's L LE exercises. Pt with improved SLR with little extensor lag. Added hamstring strengthening and modified knee flexion stretching into prone. Continued mobilization of her knee and elbow to improve ROM. Can likely be weaned to one crutch or no crutch next session.    Personal Factors and Comorbidities Age;Time since onset of injury/illness/exacerbation;Comorbidity 1    Comorbidities Pt with large lateral/posterior L thigh wound and L medial malleolus wound (seeing [facility] wound care)    Examination-Activity Limitations Bathing;Bend;Caring  for Others;Carry;Locomotion Level;Stairs;Squat;Stand;Transfers    Examination-Participation Restrictions Occupation;Cleaning;Community Activity;Driving;Yard Work    Rehab Potential Good    PT Frequency 2x / week    PT Duration 12 weeks    PT  Treatment/Interventions ADLs/Self Care Home Management;Cryotherapy;Electrical Stimulation;Iontophoresis 4mg /ml Dexamethasone;Moist Heat;Ultrasound;DME Instruction;Gait training;Stair training;Functional mobility training;Therapeutic activities;Therapeutic exercise;Balance training;Neuromuscular re-education;Patient/family education;Manual techniques;Dry needling;Passive range of motion;Taping;Vasopneumatic Device;Joint Manipulations;Scar mobilization    PT Next Visit Plan Assess response to HEP. Manual therapy and exercises for improved ankle and elbow ROM. Continue strengthening L LE and L elbow. Work on knee flexion ROM. Gait training off a/d or progress to 1 crutch only. Consider machine strengthening on leg press and hamstring curl.    PT Home Exercise Plan Access Code [email or web address]    Consulted and Agree with Plan of Care Patient;Family member/caregiver    Family Member Consulted Mother           Patient will benefit from skilled therapeutic intervention in order to improve the following deficits and impairments:  Decreased balance,Decreased endurance,Decreased mobility,Difficulty walking,Hypomobility  Visit Diagnosis: Difficulty in walking, not elsewhere classified  Stiffness of left ankle, not elsewhere classified  Muscle weakness (generalized)  Other abnormalities of gait and mobility     Problem List Patient Active Problem List   Diagnosis Date Noted  . S/P TAH (total abdominal hysterectomy) 12/15/2019  . Chronic migraine 09/24/2017  . Confusion 07/23/2017  . Cholelithiasis 09/19/2012  . History of gastric bypass 09/19/2012    [facility] April Ma L Calel Pisarski PT, DPT 02/15/2021, 4:59 PM  [facility] [address] [place], [place], [address] Phone: [phone]   Fax:  [phone]  Name: Crystal Conner MRN: [id number] Date of Birth: 18-Mar-1976

## 2021-02-18 ENCOUNTER — Other Ambulatory Visit: Payer: Self-pay

## 2021-02-18 ENCOUNTER — Ambulatory Visit (HOSPITAL_BASED_OUTPATIENT_CLINIC_OR_DEPARTMENT_OTHER): Payer: BC Managed Care – PPO | Admitting: Physical Therapy

## 2021-02-18 DIAGNOSIS — M6281 Muscle weakness (generalized): Secondary | ICD-10-CM

## 2021-02-18 DIAGNOSIS — R262 Difficulty in walking, not elsewhere classified: Secondary | ICD-10-CM

## 2021-02-18 DIAGNOSIS — M25672 Stiffness of left ankle, not elsewhere classified: Secondary | ICD-10-CM

## 2021-02-18 DIAGNOSIS — R2689 Other abnormalities of gait and mobility: Secondary | ICD-10-CM

## 2021-02-18 NOTE — Therapy (Signed)
[place], [place], [address] Phone: [phone]   Fax:  [phone]  Physical Therapy Treatment  Patient Details  Name: Crystal Conner MRN: [id number] Date of Birth: 07-07-1976 Referring Provider (PT): Rayburn Felt, MD   Encounter Date: 02/18/2021   PT End of Session - 02/18/21 1714    Visit Number 7    Number of Visits 25    Date for PT Re-Evaluation 04/20/21    Authorization Type BCBS    PT Start Time 1515    PT Stop Time 1600    PT Time Calculation (min) 45 min    Activity Tolerance Patient tolerated treatment well    Behavior During Therapy [facility] for tasks assessed/performed           Past Medical History:  Diagnosis Date  . Anemia   . Arthritis   . Depression   . Fibromyalgia   . Gallstones   . Hypertension   . Seizures ([place])     Past Surgical History:  Procedure Laterality Date  . ABDOMINAL HYSTERECTOMY Bilateral 12/15/2019   Procedure: HYSTERECTOMY ABDOMINAL with bilateral salpingectomy;  Surgeon: Louretta Shorten, MD;  Location: [place];  Service: Gynecology;  Laterality: Bilateral;  . ABDOMINOPLASTY    . ADENOIDECTOMY    . CHOLECYSTECTOMY  10/30/2012   Procedure: LAPAROSCOPIC CHOLECYSTECTOMY WITH INTRAOPERATIVE CHOLANGIOGRAM;  Surgeon: Merrie Roof, MD;  [facility];  Service: General;  Laterality: N/A;  . GASTRIC BYPASS  2001  . KNEE SURGERY  1993   left knee  . TONSILECTOMY, ADENOIDECTOMY, BILATERAL MYRINGOTOMY AND TUBES    . TONSILLECTOMY    . TYMPANOSTOMY TUBE PLACEMENT      There were no vitals filed for this visit.   Subjective Assessment - 02/18/21 1709    Subjective Pt states she's been working on her exercises to improve her knee ROM.    Patient is accompained by: Family member    How long can you sit comfortably? n/a    How long can you stand comfortably? Recently cleared for full weightbearing    How long can you walk comfortably? Recently cleared for full  weightbearing    Patient Stated Goals Pt would like to be able to safely ascend/descend steps to her room.                             [facility] Adult PT Treatment/Exercise - 02/18/21 0001      Ambulation/Gait   Ambulation Distance (Feet) 150 Feet    Assistive device None    Gait Pattern Step-through pattern;Decreased weight shift to left;Antalgic;Lateral hip instability;Decreased step length - right;Decreased stance time - left    Ambulation Surface Level;Indoor    Gait Comments Cues for heel strike, quad contraction and maintaining trunk ext. Focus on improving L weight shift and increase R foot step length      Knee/Hip Exercises: Aerobic   Other Aerobic Sci-fit x5 min working on knee ROM      Knee/Hip Exercises: Standing   Lateral Step Up Right;Left;2 sets;10 reps;Hand Hold: 2    Other Standing Knee Exercises Foot taps on to step 2x10    Other Standing Knee Exercises Lateral band walk 2x25' orange tband      Knee/Hip Exercises: Seated   Stool Scoot - Round Trips 3x12'      Knee/Hip Exercises: Supine   Straight Leg Raises Strengthening;Left;2 sets;10 reps    Straight Leg Raises  Limitations 2 lbs    Knee Flexion AAROM;Left;2 sets;10 reps   Working on end range flexion   Knee Flexion Limitations ~30 sec stretch at end range                    PT Short Term Goals - 01/26/21 1139      PT SHORT TERM GOAL #1   Title Pt will be independent with her HEP    Time 6    Period Weeks    Status New    Target Date 03/09/21      PT SHORT TERM GOAL #2   Title Pt will be able to amb at least 150' with reciprocal gait pattern using RW for home mobility    Baseline Antalgic step to gait pattern currently ~50' with RW    Time 6    Period Weeks    Status New    Target Date 03/09/21      PT SHORT TERM GOAL #3   Title Pt will be have L ankle DF ROM to at least neutral for standing tasks    Baseline 15 deg from neutral    Time 6    Period Weeks    Status New     Target Date 03/09/21      PT SHORT TERM GOAL #4   Title Pt will be able to safely ascend/descend 12 steps mod I to get to her upstairs bedroom    Baseline Pt has not attempted yet    Time 6    Period Weeks    Status New    Target Date 03/09/21             PT Long Term Goals - 01/26/21 1142      PT LONG TERM GOAL #1   Title Pt will be independent with final HEP    Time 12    Period Weeks    Status New    Target Date 04/20/21      PT LONG TERM GOAL #2   Title Pt will be able to amb without a/d at least 400' for community mobility    Time 12    Period Weeks    Status New    Target Date 04/20/21      PT LONG TERM GOAL #3   Title Pt will be able to perform 10 full squats to demo functional LE strength    Time 12    Period Weeks    Status New    Target Date 04/20/21      PT LONG TERM GOAL #4   Title Pt will be able to ascend/descend steps independently with reciprocal gait pattern for home mobility    Time 12    Period Weeks    Status New    Target Date 04/20/21                 Plan - 02/18/21 1709    Clinical Impression Statement Treatment focused on quad and hip strengthening in standing. Continued knee mobilization and stretching into flexion. Pt able to obtain 92deg of knee flexion in supine. Performed gait training with no a/d; however, pt demos hip instability.    Personal Factors and Comorbidities Age;Time since onset of injury/illness/exacerbation;Comorbidity 1    Comorbidities Pt with large lateral/posterior L thigh wound and L medial malleolus wound (seeing [facility] care)    Examination-Activity Limitations Bathing;Bend;Caring for Others;Carry;Locomotion Level;Stairs;Squat;Stand;Transfers    Examination-Participation Restrictions Occupation;Cleaning;Community Activity;Driving;[employer] Work  Rehab Potential Good    PT Frequency 2x / week    PT Duration 12 weeks    PT Treatment/Interventions ADLs/Self Care Home Management;Cryotherapy;Electrical  Stimulation;Iontophoresis 4mg /ml Dexamethasone;Moist Heat;Ultrasound;DME Instruction;Gait training;Stair training;Functional mobility training;Therapeutic activities;Therapeutic exercise;Balance training;Neuromuscular re-education;Patient/family education;Manual techniques;Dry needling;Passive range of motion;Taping;Vasopneumatic Device;Joint Manipulations;Scar mobilization    PT Next Visit Plan Assess response to HEP. Manual therapy and exercises for improved ankle and elbow ROM. Continue strengthening L LE and L elbow. Work on knee flexion ROM. Gait training off a/d or progress to 1 crutch only. Consider machine strengthening on leg press and hamstring curl.    PT Home Exercise Plan Access Code [email or web address]    Consulted and Agree with Plan of Care Patient;Family member/caregiver    Family Member Consulted Mother           Patient will benefit from skilled therapeutic intervention in order to improve the following deficits and impairments:  Decreased balance,Decreased endurance,Decreased mobility,Difficulty walking,Hypomobility  Visit Diagnosis: Difficulty in walking, not elsewhere classified  Stiffness of left ankle, not elsewhere classified  Other abnormalities of gait and mobility  Muscle weakness (generalized)     Problem List Patient Active Problem List   Diagnosis Date Noted  . S/P TAH (total abdominal hysterectomy) 12/15/2019  . Chronic migraine 09/24/2017  . Confusion 07/23/2017  . Cholelithiasis 09/19/2012  . History of gastric bypass 09/19/2012    [facility] April Ma L Lander Eslick PT, DPT 02/18/2021, 5:15 PM  [facility] [address] [place], [place], [address] Phone: [phone]   Fax:  [phone]  Name: ROSAELENA KEMNITZ MRN: [id number] Date of Birth: [birth year]

## 2021-02-22 ENCOUNTER — Other Ambulatory Visit: Payer: Self-pay

## 2021-02-22 ENCOUNTER — Ambulatory Visit (HOSPITAL_BASED_OUTPATIENT_CLINIC_OR_DEPARTMENT_OTHER): Payer: BC Managed Care – PPO | Admitting: Physical Therapy

## 2021-02-22 DIAGNOSIS — R262 Difficulty in walking, not elsewhere classified: Secondary | ICD-10-CM

## 2021-02-22 DIAGNOSIS — R2689 Other abnormalities of gait and mobility: Secondary | ICD-10-CM

## 2021-02-22 DIAGNOSIS — M6281 Muscle weakness (generalized): Secondary | ICD-10-CM

## 2021-02-22 DIAGNOSIS — M25672 Stiffness of left ankle, not elsewhere classified: Secondary | ICD-10-CM

## 2021-02-22 NOTE — Therapy (Signed)
[place], [place], [address] Phone: [phone]   Fax:  [phone]  Physical Therapy Treatment  Patient Details  Name: Crystal Conner MRN: [id number] Date of Birth: [birth year] Referring Provider (PT): Rayburn Felt, MD   Encounter Date: 02/22/2021   PT End of Session - 02/22/21 1655    Visit Number 8    Number of Visits 25    Date for PT Re-Evaluation 04/20/21    Authorization Type BCBS    PT Start Time 1555    PT Stop Time 1645    PT Time Calculation (min) 50 min    Activity Tolerance Patient tolerated treatment well    Behavior During Therapy [facility] for tasks assessed/performed           Past Medical History:  Diagnosis Date  . Anemia   . Arthritis   . Depression   . Fibromyalgia   . Gallstones   . Hypertension   . Seizures ([place])     Past Surgical History:  Procedure Laterality Date  . ABDOMINAL HYSTERECTOMY Bilateral 12/15/2019   Procedure: HYSTERECTOMY ABDOMINAL with bilateral salpingectomy;  Surgeon: Louretta Shorten, MD;  Location: [place];  Service: Gynecology;  Laterality: Bilateral;  . ABDOMINOPLASTY    . ADENOIDECTOMY    . CHOLECYSTECTOMY  10/30/2012   Procedure: LAPAROSCOPIC CHOLECYSTECTOMY WITH INTRAOPERATIVE CHOLANGIOGRAM;  Surgeon: Merrie Roof, MD;  Location: [facility];  Service: General;  Laterality: N/A;  . GASTRIC BYPASS  2001  . KNEE SURGERY  1993   left knee  . TONSILECTOMY, ADENOIDECTOMY, BILATERAL MYRINGOTOMY AND TUBES    . TONSILLECTOMY    . TYMPANOSTOMY TUBE PLACEMENT      There were no vitals filed for this visit.   Subjective Assessment - 02/22/21 1658    Subjective Pt reports she has been walking less today so is feeling more stiff.    Patient is accompained by: Family member    How long can you sit comfortably? n/a    How long can you stand comfortably? Recently cleared for full weightbearing    How long can you walk comfortably? Recently cleared for full  weightbearing    Patient Stated Goals Pt would like to be able to safely ascend/descend steps to her room.                             [facility] Adult PT Treatment/Exercise - 02/22/21 0001      Elbow Exercises   Elbow Flexion AROM;Left;10 reps;Supine    Elbow Extension AROM;10 reps;Supine;Left    Forearm Supination AROM;Strengthening;Left   2 lbs   Bar Weights/Barbell (Forearm Supination) 1 lb    Other elbow exercises Radial deviation orange tband 2x10; ulner deviation orange tband 2x10; wrist flexion orange tband 2x10    Other elbow exercises Elbow flexion stretch x30 sec with PNF; Elbow extension stretch x30 sec with PNF      Manual Therapy   Joint Mobilization Humeroulner distraction 3x10 sec; humeroradial distraction 3x10 sec; distal ulner and humeral PA/AP glides for pronation/supination; humeroulner flexion 3x10 sec                    PT Short Term Goals - 01/26/21 1139      PT SHORT TERM GOAL #1   Title Pt will be independent with her HEP    Time 6    Period Weeks    Status New  Target Date 03/09/21      PT SHORT TERM GOAL #2   Title Pt will be able to amb at least 150' with reciprocal gait pattern using RW for home mobility    Baseline Antalgic step to gait pattern currently ~50' with RW    Time 6    Period Weeks    Status New    Target Date 03/09/21      PT SHORT TERM GOAL #3   Title Pt will be have L ankle DF ROM to at least neutral for standing tasks    Baseline 15 deg from neutral    Time 6    Period Weeks    Status New    Target Date 03/09/21      PT SHORT TERM GOAL #4   Title Pt will be able to safely ascend/descend 12 steps mod I to get to her upstairs bedroom    Baseline Pt has not attempted yet    Time 6    Period Weeks    Status New    Target Date 03/09/21             PT Long Term Goals - 01/26/21 1142      PT LONG TERM GOAL #1   Title Pt will be independent with final HEP    Time 12    Period Weeks    Status  New    Target Date 04/20/21      PT LONG TERM GOAL #2   Title Pt will be able to amb without a/d at least 400' for community mobility    Time 12    Period Weeks    Status New    Target Date 04/20/21      PT LONG TERM GOAL #3   Title Pt will be able to perform 10 full squats to demo functional LE strength    Time 12    Period Weeks    Status New    Target Date 04/20/21      PT LONG TERM GOAL #4   Title Pt will be able to ascend/descend steps independently with reciprocal gait pattern for home mobility    Time 12    Period Weeks    Status New    Target Date 04/20/21                  Patient will benefit from skilled therapeutic intervention in order to improve the following deficits and impairments:     Visit Diagnosis: Difficulty in walking, not elsewhere classified  Stiffness of left ankle, not elsewhere classified  Other abnormalities of gait and mobility  Muscle weakness (generalized)     Problem List Patient Active Problem List   Diagnosis Date Noted  . S/P TAH (total abdominal hysterectomy) 12/15/2019  . Chronic migraine 09/24/2017  . Confusion 07/23/2017  . Cholelithiasis 09/19/2012  . History of gastric bypass 09/19/2012    [facility] April Ma L Jayshawn Colston PT, DPT 02/22/2021, 4:58 PM  [facility] [place], [place], [address] Phone: [phone]   Fax:  [phone]  Name: Crystal Conner MRN: [id number] Date of Birth: Jul 26, 1976

## 2021-02-25 ENCOUNTER — Ambulatory Visit (HOSPITAL_BASED_OUTPATIENT_CLINIC_OR_DEPARTMENT_OTHER): Payer: BC Managed Care – PPO | Admitting: Physical Therapy

## 2021-02-25 ENCOUNTER — Other Ambulatory Visit: Payer: Self-pay

## 2021-02-25 DIAGNOSIS — M25672 Stiffness of left ankle, not elsewhere classified: Secondary | ICD-10-CM

## 2021-02-25 DIAGNOSIS — M6281 Muscle weakness (generalized): Secondary | ICD-10-CM

## 2021-02-25 DIAGNOSIS — R2689 Other abnormalities of gait and mobility: Secondary | ICD-10-CM

## 2021-02-25 DIAGNOSIS — R262 Difficulty in walking, not elsewhere classified: Secondary | ICD-10-CM | POA: Diagnosis not present

## 2021-02-25 NOTE — Therapy (Signed)
[place], [place], [address] Phone: [phone]   Fax:  [phone]  Physical Therapy Treatment  Patient Details  Name: Crystal Conner MRN: [id number] Date of Birth: 1976/11/19 Referring Provider (PT): Rayburn Felt, MD   Encounter Date: 02/25/2021   PT End of Session - 02/25/21 1352    Visit Number 9    Number of Visits 25    Date for PT Re-Evaluation 04/20/21    Authorization Type BCBS    PT Start Time 1300    PT Stop Time 1345    PT Time Calculation (min) 45 min    Activity Tolerance Patient tolerated treatment well    Behavior During Therapy [facility] for tasks assessed/performed           Past Medical History:  Diagnosis Date  . Anemia   . Arthritis   . Depression   . Fibromyalgia   . Gallstones   . Hypertension   . Seizures ([place])     Past Surgical History:  Procedure Laterality Date  . ABDOMINAL HYSTERECTOMY Bilateral 12/15/2019   Procedure: HYSTERECTOMY ABDOMINAL with bilateral salpingectomy;  Surgeon: Louretta Shorten, MD;  Location: [place];  Service: Gynecology;  Laterality: Bilateral;  . ABDOMINOPLASTY    . ADENOIDECTOMY    . CHOLECYSTECTOMY  10/30/2012   Procedure: LAPAROSCOPIC CHOLECYSTECTOMY WITH INTRAOPERATIVE CHOLANGIOGRAM;  Surgeon: Merrie Roof, MD;  Location: [facility];  Service: General;  Laterality: N/A;  . GASTRIC BYPASS  2001  . KNEE SURGERY  1993   left knee  . TONSILECTOMY, ADENOIDECTOMY, BILATERAL MYRINGOTOMY AND TUBES    . TONSILLECTOMY    . TYMPANOSTOMY TUBE PLACEMENT      There were no vitals filed for this visit.   Subjective Assessment - 02/25/21 1301    Subjective Pt reports she got a wireless mouse and keyboard for improved comfort with her work station. Pt reports feeling of something sticking into her L thigh when initially bending her knee after sleeping-- pt currently sleeps with leg elevated on a stiff pillow.    Patient is accompained by: Family member     How long can you sit comfortably? n/a    How long can you stand comfortably? Recently cleared for full weightbearing    How long can you walk comfortably? Recently cleared for full weightbearing    Patient Stated Goals Pt would like to be able to safely ascend/descend steps to her room.    Currently in Pain? No/denies                             [facility] Adult PT Treatment/Exercise - 02/25/21 0001      Ambulation/Gait   Ambulation Distance (Feet) 50 Feet    Assistive device R Axillary Crutch    Gait Pattern Step-through pattern;Decreased weight shift to left;Antalgic;Lateral hip instability;Decreased step length - right;Decreased stance time - left    Ambulation Surface Level;Indoor    Gait Comments Cues for equal step length bilat and reduce L trunk lean      Knee/Hip Exercises: Aerobic   Other Aerobic Sci-fit x5 min working on knee ROM      Knee/Hip Exercises: Machines for Strengthening   Total Gym Leg Press 2x10 @ 55#; x5 @ 70#    Other [employer] fitness hamstring curl 2x10 @ 35#      Knee/Hip Exercises: Standing   Terminal Knee Extension Strengthening;3 sets;10 reps;Theraband    Theraband  Level (Terminal Knee Extension) Level 3 (Green)    Wall Squat 1 set;10 reps    Other Standing Knee Exercises Backwards band walking 2x25' green tband      Knee/Hip Exercises: Seated   Hamstring Curl Strengthening;Left;1 set;10 reps    Hamstring Limitations green tband      Manual Therapy   Manual Therapy Passive ROM    Passive ROM L knee flexion with PNF in supine and leg in 90/90                    PT Short Term Goals - 01/26/21 1139      PT SHORT TERM GOAL #1   Title Pt will be independent with her HEP    Time 6    Period Weeks    Status New    Target Date 03/09/21      PT SHORT TERM GOAL #2   Title Pt will be able to amb at least 150' with reciprocal gait pattern using RW for home mobility    Baseline Antalgic step to gait pattern currently  ~50' with RW    Time 6    Period Weeks    Status New    Target Date 03/09/21      PT SHORT TERM GOAL #3   Title Pt will be have L ankle DF ROM to at least neutral for standing tasks    Baseline 15 deg from neutral    Time 6    Period Weeks    Status New    Target Date 03/09/21      PT SHORT TERM GOAL #4   Title Pt will be able to safely ascend/descend 12 steps mod I to get to her upstairs bedroom    Baseline Pt has not attempted yet    Time 6    Period Weeks    Status New    Target Date 03/09/21             PT Long Term Goals - 01/26/21 1142      PT LONG TERM GOAL #1   Title Pt will be independent with final HEP    Time 12    Period Weeks    Status New    Target Date 04/20/21      PT LONG TERM GOAL #2   Title Pt will be able to amb without a/d at least 400' for community mobility    Time 12    Period Weeks    Status New    Target Date 04/20/21      PT LONG TERM GOAL #3   Title Pt will be able to perform 10 full squats to demo functional LE strength    Time 12    Period Weeks    Status New    Target Date 04/20/21      PT LONG TERM GOAL #4   Title Pt will be able to ascend/descend steps independently with reciprocal gait pattern for home mobility    Time 12    Period Weeks    Status New    Target Date 04/20/21                 Plan - 02/25/21 1351    Clinical Impression Statement Continued focus on quad and hip strengthening in standing. Continued L knee ROM. Pt able to increase weightshift to L. Progressed and modified HEP accordingly.    Personal Factors and Comorbidities Age;Time since onset of injury/illness/exacerbation;Comorbidity 1  Comorbidities Pt with large lateral/posterior L thigh wound and L medial malleolus wound (seeing [place] wound care)    Examination-Activity Limitations Bathing;Bend;Caring for Others;Carry;Locomotion Level;Stairs;Squat;Stand;Transfers    Examination-Participation Restrictions Occupation;Cleaning;Community  Activity;Driving;Yard Work    Rehab Potential Good    PT Frequency 2x / week    PT Duration 12 weeks    PT Treatment/Interventions ADLs/Self Care Home Management;Cryotherapy;Electrical Stimulation;Iontophoresis 4mg /ml Dexamethasone;Moist Heat;Ultrasound;DME Instruction;Gait training;Stair training;Functional mobility training;Therapeutic activities;Therapeutic exercise;Balance training;Neuromuscular re-education;Patient/family education;Manual techniques;Dry needling;Passive range of motion;Taping;Vasopneumatic Device;Joint Manipulations;Scar mobilization    PT Next Visit Plan Assess response to HEP. Manual therapy and exercises for improved ankle and elbow ROM. Continue strengthening L LE and L elbow. Work on knee flexion ROM. Gait training off a/d or progress to 1 crutch only. Consider machine strengthening on leg press and hamstring curl.    PT Home Exercise Plan Access Code [email or web address]    Consulted and Agree with Plan of Care Patient;Family member/caregiver    Family Member Consulted Mother           Patient will benefit from skilled therapeutic intervention in order to improve the following deficits and impairments:  Decreased balance,Decreased endurance,Decreased mobility,Difficulty walking,Hypomobility  Visit Diagnosis: Difficulty in walking, not elsewhere classified  Stiffness of left ankle, not elsewhere classified  Other abnormalities of gait and mobility  Muscle weakness (generalized)     Problem List Patient Active Problem List   Diagnosis Date Noted  . S/P TAH (total abdominal hysterectomy) 12/15/2019  . Chronic migraine 09/24/2017  . Confusion 07/23/2017  . Cholelithiasis 09/19/2012  . History of gastric bypass 09/19/2012    [facility] April Ma L Kandee Escalante PT, DPT 02/25/2021, 1:53 PM  [facility] [address] [place], [place], [address] Phone: [phone]   Fax:  [phone]  Name: Crystal Conner MRN:  [id number] Date of Birth: 1976-09-15

## 2021-03-01 ENCOUNTER — Other Ambulatory Visit: Payer: Self-pay

## 2021-03-01 ENCOUNTER — Ambulatory Visit (HOSPITAL_BASED_OUTPATIENT_CLINIC_OR_DEPARTMENT_OTHER): Payer: BC Managed Care – PPO | Admitting: Physical Therapy

## 2021-03-01 DIAGNOSIS — R2689 Other abnormalities of gait and mobility: Secondary | ICD-10-CM

## 2021-03-01 DIAGNOSIS — R262 Difficulty in walking, not elsewhere classified: Secondary | ICD-10-CM

## 2021-03-01 DIAGNOSIS — M25672 Stiffness of left ankle, not elsewhere classified: Secondary | ICD-10-CM

## 2021-03-01 DIAGNOSIS — M6281 Muscle weakness (generalized): Secondary | ICD-10-CM

## 2021-03-01 NOTE — Therapy (Signed)
[place], [place], [address] Phone: [phone]   Fax:  [phone]  Physical Therapy Treatment  Patient Details  Name: Crystal Conner MRN: [id number] Date of Birth: [birth year] Referring Provider (PT): Rayburn Felt, MD   Encounter Date: 03/01/2021   PT End of Session - 03/01/21 1655    Visit Number 10    Number of Visits 25    Date for PT Re-Evaluation 04/20/21    Authorization Type BCBS    PT Start Time 1600    PT Stop Time 1645    PT Time Calculation (min) 45 min    Activity Tolerance Patient tolerated treatment well    Behavior During Therapy [facility] for tasks assessed/performed           Past Medical History:  Diagnosis Date  . Anemia   . Arthritis   . Depression   . Fibromyalgia   . Gallstones   . Hypertension   . Seizures ([place])     Past Surgical History:  Procedure Laterality Date  . ABDOMINAL HYSTERECTOMY Bilateral 12/15/2019   Procedure: HYSTERECTOMY ABDOMINAL with bilateral salpingectomy;  Surgeon: Louretta Shorten, MD;  Location: [place];  Service: Gynecology;  Laterality: Bilateral;  . ABDOMINOPLASTY    . ADENOIDECTOMY    . CHOLECYSTECTOMY  10/30/2012   Procedure: LAPAROSCOPIC CHOLECYSTECTOMY WITH INTRAOPERATIVE CHOLANGIOGRAM;  Surgeon: Merrie Roof, MD;  Location[facility];  Service: General;  Laterality: N/A;  . GASTRIC BYPASS  2001  . KNEE SURGERY  1993   left knee  . TONSILECTOMY, ADENOIDECTOMY, BILATERAL MYRINGOTOMY AND TUBES    . TONSILLECTOMY    . TYMPANOSTOMY TUBE PLACEMENT      There were no vitals filed for this visit.   Subjective Assessment - 03/01/21 1601    Subjective Pt states she rearranged her sleeping position and it has helped with her comfort. Pt states elbow feels really tight.    Patient is accompained by: Family member    How long can you sit comfortably? n/a    How long can you stand comfortably? Recently cleared for full weightbearing    How long can  you walk comfortably? Recently cleared for full weightbearing    Patient Stated Goals Pt would like to be able to safely ascend/descend steps to her room.    Currently in Pain? No/denies                             [facility] Adult PT Treatment/Exercise - 03/01/21 0001      Elbow Exercises   Elbow Flexion AROM;Left;10 reps;Supine    Bar Weights/Barbell (Elbow Flexion) 2 lbs    Elbow Extension AROM;10 reps;Supine;Left    Bar Weights/Barbell (Elbow Extension) 2 lbs    Forearm Supination AROM;Strengthening;Left;Supine    Bar Weights/Barbell (Forearm Supination) 2 lbs    Forearm Pronation AROM;Strengthening;Left;10 reps    Bar Weights/Barbell (Forearm Pronation) 2 lbs    Other elbow exercises Radial deviation 2 lbs 2x10; ulner deviation 2lbs 2x10; wrist flexion 2lbs 2x10; wrist extension 2 lbs 2x10      Knee/Hip Exercises: Aerobic   Other Aerobic Sci-fit x5 min working on knee ROM      Manual Therapy   Joint Mobilization Humeroulner distraction 3x10 sec; humeroradial distraction 3x10 sec; proximal ulner and humeral PA/AP glides for flex/ext; humeroulner and humeroradial flexion & extension grade III mobilization  PT Education - 03/01/21 1659    Education Details Discussed that L elbow flexion may likely have scar tissue inhibiting further motion but that PT can try and stretch it as much as possible with a lot of pain    Person(s) Educated Patient;Parent(s)    Methods Explanation    Comprehension Verbalized understanding            PT Short Term Goals - 01/26/21 1139      PT SHORT TERM GOAL #1   Title Pt will be independent with her HEP    Time 6    Period Weeks    Status New    Target Date 03/09/21      PT SHORT TERM GOAL #2   Title Pt will be able to amb at least 150' with reciprocal gait pattern using RW for home mobility    Baseline Antalgic step to gait pattern currently ~50' with RW    Time 6    Period Weeks    Status New     Target Date 03/09/21      PT SHORT TERM GOAL #3   Title Pt will be have L ankle DF ROM to at least neutral for standing tasks    Baseline 15 deg from neutral    Time 6    Period Weeks    Status New    Target Date 03/09/21      PT SHORT TERM GOAL #4   Title Pt will be able to safely ascend/descend 12 steps mod I to get to her upstairs bedroom    Baseline Pt has not attempted yet    Time 6    Period Weeks    Status New    Target Date 03/09/21             PT Long Term Goals - 01/26/21 1142      PT LONG TERM GOAL #1   Title Pt will be independent with final HEP    Time 12    Period Weeks    Status New    Target Date 04/20/21      PT LONG TERM GOAL #2   Title Pt will be able to amb without a/d at least 400' for community mobility    Time 12    Period Weeks    Status New    Target Date 04/20/21      PT LONG TERM GOAL #3   Title Pt will be able to perform 10 full squats to demo functional LE strength    Time 12    Period Weeks    Status New    Target Date 04/20/21      PT LONG TERM GOAL #4   Title Pt will be able to ascend/descend steps independently with reciprocal gait pattern for home mobility    Time 12    Period Weeks    Status New    Target Date 04/20/21                 Plan - 03/01/21 1605    Clinical Impression Statement Treatment session focused on L elbow ROM and strength. Provided manual therapy as indicated.    Personal Factors and Comorbidities Age;Time since onset of injury/illness/exacerbation;Comorbidity 1    Comorbidities Pt with large lateral/posterior L thigh wound and L medial malleolus wound (seeing [place] wound care)    Examination-Activity Limitations Bathing;Bend;Caring for Others;Carry;Locomotion Level;Stairs;Squat;Stand;Transfers    Examination-Participation Restrictions Occupation;Cleaning;Community Activity;Driving;Yard Work    Designer, multimedia  PT Frequency 2x / week    PT Duration 12 weeks    PT Treatment/Interventions  ADLs/Self Care Home Management;Cryotherapy;Electrical Stimulation;Iontophoresis 4mg /ml Dexamethasone;Moist Heat;Ultrasound;DME Instruction;Gait training;Stair training;Functional mobility training;Therapeutic activities;Therapeutic exercise;Balance training;Neuromuscular re-education;Patient/family education;Manual techniques;Dry needling;Passive range of motion;Taping;Vasopneumatic Device;Joint Manipulations;Scar mobilization    PT Next Visit Plan Assess response to HEP. Manual therapy and exercises for improved ankle and elbow ROM. Continue strengthening L LE and L elbow. Work on knee flexion ROM. Gait training off a/d or progress to 1 crutch only. Consider machine strengthening on leg press and hamstring curl.    PT Home Exercise Plan Access Code [email or web address]    Consulted and Agree with Plan of Care Patient;Family member/caregiver    Family Member Consulted Mother           Patient will benefit from skilled therapeutic intervention in order to improve the following deficits and impairments:  Decreased balance,Decreased endurance,Decreased mobility,Difficulty walking,Hypomobility  Visit Diagnosis: Difficulty in walking, not elsewhere classified  Stiffness of left ankle, not elsewhere classified  Other abnormalities of gait and mobility  Muscle weakness (generalized)     Problem List Patient Active Problem List   Diagnosis Date Noted  . S/P TAH (total abdominal hysterectomy) 12/15/2019  . Chronic migraine 09/24/2017  . Confusion 07/23/2017  . Cholelithiasis 09/19/2012  . History of gastric bypass 09/19/2012    [facility] April Ma L Shakiera Edelson PT, DPT 03/01/2021, 5:00 PM  [facility] [place], [place], [address] Phone: [phone]   Fax:  [phone]  Name: Crystal Conner MRN: [id number] Date of Birth: December 16, 1975

## 2021-03-04 ENCOUNTER — Ambulatory Visit (HOSPITAL_BASED_OUTPATIENT_CLINIC_OR_DEPARTMENT_OTHER): Payer: BC Managed Care – PPO | Attending: Orthopedic Surgery | Admitting: Physical Therapy

## 2021-03-04 ENCOUNTER — Other Ambulatory Visit: Payer: Self-pay

## 2021-03-04 DIAGNOSIS — R262 Difficulty in walking, not elsewhere classified: Secondary | ICD-10-CM | POA: Diagnosis present

## 2021-03-04 DIAGNOSIS — M25672 Stiffness of left ankle, not elsewhere classified: Secondary | ICD-10-CM | POA: Diagnosis present

## 2021-03-04 DIAGNOSIS — R2689 Other abnormalities of gait and mobility: Secondary | ICD-10-CM | POA: Diagnosis present

## 2021-03-04 DIAGNOSIS — M25622 Stiffness of left elbow, not elsewhere classified: Secondary | ICD-10-CM | POA: Insufficient documentation

## 2021-03-04 DIAGNOSIS — M6281 Muscle weakness (generalized): Secondary | ICD-10-CM | POA: Insufficient documentation

## 2021-03-04 NOTE — Therapy (Signed)
[place], [place], [address] Phone: [phone]   Fax:  [phone]  Physical Therapy Treatment  Patient Details  Name: SAMIYAH STUPKA MRN: [id number] Date of Birth: 11-Apr-1976 Referring Provider (PT): Rayburn Felt, MD   Encounter Date: 03/04/2021   PT End of Session - 03/04/21 1350    Visit Number 11    Number of Visits 25    Date for PT Re-Evaluation 04/20/21    Authorization Type BCBS    PT Start Time 1345    PT Stop Time 1430    PT Time Calculation (min) 45 min    Activity Tolerance Patient tolerated treatment well    Behavior During Therapy [facility] for tasks assessed/performed           Past Medical History:  Diagnosis Date  . Anemia   . Arthritis   . Depression   . Fibromyalgia   . Gallstones   . Hypertension   . Seizures ([place])     Past Surgical History:  Procedure Laterality Date  . ABDOMINAL HYSTERECTOMY Bilateral 12/15/2019   Procedure: HYSTERECTOMY ABDOMINAL with bilateral salpingectomy;  Surgeon: Louretta Shorten, MD;  Location: [facility];  Service: Gynecology;  Laterality: Bilateral;  . ABDOMINOPLASTY    . ADENOIDECTOMY    . CHOLECYSTECTOMY  10/30/2012   Procedure: LAPAROSCOPIC CHOLECYSTECTOMY WITH INTRAOPERATIVE CHOLANGIOGRAM;  Surgeon: Merrie Roof, MD;  Location: [facility];  Service: General;  Laterality: N/A;  . GASTRIC BYPASS  2001  . KNEE SURGERY  1993   left knee  . TONSILECTOMY, ADENOIDECTOMY, BILATERAL MYRINGOTOMY AND TUBES    . TONSILLECTOMY    . TYMPANOSTOMY TUBE PLACEMENT      There were no vitals filed for this visit.   Subjective Assessment - 03/04/21 1348    Subjective Pt reports nothing new or different. Pt states her wound is almost closed. Pt is to see ortho 4/18.    Patient is accompained by: Family member    How long can you sit comfortably? n/a    How long can you stand comfortably? Recently cleared for full weightbearing    How long can you walk  comfortably? Recently cleared for full weightbearing    Patient Stated Goals Pt would like to be able to safely ascend/descend steps to her room.    Currently in Pain? No/denies                             [facility] Adult PT Treatment/Exercise - 03/04/21 0001      Ambulation/Gait   Ambulation Distance (Feet) 100 Feet    Assistive device None    Gait Pattern Step-through pattern;Decreased weight shift to left;Antalgic;Decreased step length - right;Decreased stance time - left   Trunk flexion   Ambulation Surface Level;Indoor    Pre-Gait Activities L heel strike with quad set x10      Knee/Hip Exercises: Aerobic   Other Aerobic Sci-fit x5 min working on knee ROM      Knee/Hip Exercises: Machines for Strengthening   Total Gym Leg Press Double leg 2x10 @ 70#; single leg x10 @70 #    Other Machine Life fitness hamstring curl 2x10 @ 35#      Knee/Hip Exercises: Standing   Terminal Knee Extension Strengthening;10 reps;Theraband;2 sets   L knee extension with R foot tap forward/backward   Theraband Level (Terminal Knee Extension) Level 3 (Green)    Lateral Step Up --  Forward Step Up Right;Left;2 sets;10 reps;Step Height: 4"    Step Down --    Wall Squat 1 set;10 reps    Other Standing Knee Exercises staggered sit<>stand 2x10                    PT Short Term Goals - 01/26/21 1139      PT SHORT TERM GOAL #1   Title Pt will be independent with her HEP    Time 6    Period Weeks    Status New    Target Date 03/09/21      PT SHORT TERM GOAL #2   Title Pt will be able to amb at least 150' with reciprocal gait pattern using RW for home mobility    Baseline Antalgic step to gait pattern currently ~50' with RW    Time 6    Period Weeks    Status New    Target Date 03/09/21      PT SHORT TERM GOAL #3   Title Pt will be have L ankle DF ROM to at least neutral for standing tasks    Baseline 15 deg from neutral    Time 6    Period Weeks    Status New     Target Date 03/09/21      PT SHORT TERM GOAL #4   Title Pt will be able to safely ascend/descend 12 steps mod I to get to her upstairs bedroom    Baseline Pt has not attempted yet    Time 6    Period Weeks    Status New    Target Date 03/09/21             PT Long Term Goals - 01/26/21 1142      PT LONG TERM GOAL #1   Title Pt will be independent with final HEP    Time 12    Period Weeks    Status New    Target Date 04/20/21      PT LONG TERM GOAL #2   Title Pt will be able to amb without a/d at least 400' for community mobility    Time 12    Period Weeks    Status New    Target Date 04/20/21      PT LONG TERM GOAL #3   Title Pt will be able to perform 10 full squats to demo functional LE strength    Time 12    Period Weeks    Status New    Target Date 04/20/21      PT LONG TERM GOAL #4   Title Pt will be able to ascend/descend steps independently with reciprocal gait pattern for home mobility    Time 12    Period Weeks    Status New    Target Date 04/20/21                 Plan - 03/04/21 1529    Clinical Impression Statement Treatment focused on L LE strengthening, ROM, and gait. Attempting to wean pt off crutches. Pt still has issues with full L LE weightbearing without hip compensation.    Personal Factors and Comorbidities Age;Time since onset of injury/illness/exacerbation;Comorbidity 1    Comorbidities Pt with large lateral/posterior L thigh wound and L medial malleolus wound (seeing [place] wound care)    Examination-Activity Limitations Bathing;Bend;Caring for Others;Carry;Locomotion Level;Stairs;Squat;Stand;Transfers    Examination-Participation Restrictions Occupation;Cleaning;Community Activity;Driving;Yard Work    Designer, multimedia    PT  Frequency 2x / week    PT Duration 12 weeks    PT Treatment/Interventions ADLs/Self Care Home Management;Cryotherapy;Electrical Stimulation;Iontophoresis 4mg /ml Dexamethasone;Moist Heat;Ultrasound;DME  Instruction;Gait training;Stair training;Functional mobility training;Therapeutic activities;Therapeutic exercise;Balance training;Neuromuscular re-education;Patient/family education;Manual techniques;Dry needling;Passive range of motion;Taping;Vasopneumatic Device;Joint Manipulations;Scar mobilization    PT Next Visit Plan Assess response to HEP. Manual therapy and exercises for improved ankle and elbow ROM. Continue strengthening L LE and L elbow. Work on knee flexion ROM. Gait training off a/d. Consider machine strengthening on leg press and hamstring curl.    PT Home Exercise Plan Access Code [email or web address]    Consulted and Agree with Plan of Care Patient;Family member/caregiver    Family Member Consulted Mother           Patient will benefit from skilled therapeutic intervention in order to improve the following deficits and impairments:  Decreased balance,Decreased endurance,Decreased mobility,Difficulty walking,Hypomobility  Visit Diagnosis: Difficulty in walking, not elsewhere classified  Stiffness of left ankle, not elsewhere classified  Other abnormalities of gait and mobility  Muscle weakness (generalized)     Problem List Patient Active Problem List   Diagnosis Date Noted  . S/P TAH (total abdominal hysterectomy) 12/15/2019  . Chronic migraine 09/24/2017  . Confusion 07/23/2017  . Cholelithiasis 09/19/2012  . History of gastric bypass 09/19/2012    [facility] April Ma L Gracelynne Benedict PT, DPT 03/04/2021, 3:36 PM  [facility] [place] [address] [place], [place], [address] Phone: [phone]   Fax:  [phone]  Name: ARIABELLA BRIEN MRN: [id number] Date of Birth: 02/15/1976

## 2021-03-07 ENCOUNTER — Ambulatory Visit (HOSPITAL_BASED_OUTPATIENT_CLINIC_OR_DEPARTMENT_OTHER): Payer: BC Managed Care – PPO | Admitting: Physical Therapy

## 2021-03-07 ENCOUNTER — Telehealth (HOSPITAL_BASED_OUTPATIENT_CLINIC_OR_DEPARTMENT_OTHER): Payer: Self-pay | Admitting: Physical Therapy

## 2021-03-07 NOTE — Telephone Encounter (Signed)
Called pt in regards to her missed 4pm visit. Pt did not realize her schedule had changed from Tuesday and Friday appointments to Monday and Wednesday appointments. Reminded pt of her Wednesday appointment and she states she will be there.  Carston Riedl April Gordy Levan, PT, DPT

## 2021-03-09 ENCOUNTER — Ambulatory Visit (HOSPITAL_BASED_OUTPATIENT_CLINIC_OR_DEPARTMENT_OTHER): Payer: BC Managed Care – PPO | Admitting: Physical Therapy

## 2021-03-09 ENCOUNTER — Other Ambulatory Visit: Payer: Self-pay

## 2021-03-09 DIAGNOSIS — M25672 Stiffness of left ankle, not elsewhere classified: Secondary | ICD-10-CM

## 2021-03-09 DIAGNOSIS — R262 Difficulty in walking, not elsewhere classified: Secondary | ICD-10-CM

## 2021-03-09 DIAGNOSIS — M25622 Stiffness of left elbow, not elsewhere classified: Secondary | ICD-10-CM

## 2021-03-09 DIAGNOSIS — M6281 Muscle weakness (generalized): Secondary | ICD-10-CM

## 2021-03-09 DIAGNOSIS — R2689 Other abnormalities of gait and mobility: Secondary | ICD-10-CM

## 2021-03-09 NOTE — Therapy (Signed)
[place], [place], [address] Phone: [phone]   Fax:  [phone]  Physical Therapy Treatment  Patient Details  Name: CASSANDRIA DREW MRN: [id number] Date of Birth: May 23, 1976 Referring Provider (PT): Rayburn Felt, MD   Encounter Date: 03/09/2021   PT End of Session - 03/09/21 1657    Visit Number 12    Number of Visits 25    Date for PT Re-Evaluation 04/20/21    Authorization Type BCBS    PT Start Time 1600    PT Stop Time 1645    PT Time Calculation (min) 45 min    Activity Tolerance Patient tolerated treatment well    Behavior During Therapy [facility] for tasks assessed/performed           Past Medical History:  Diagnosis Date  . Anemia   . Arthritis   . Depression   . Fibromyalgia   . Gallstones   . Hypertension   . Seizures ([place])     Past Surgical History:  Procedure Laterality Date  . ABDOMINAL HYSTERECTOMY Bilateral 12/15/2019   Procedure: HYSTERECTOMY ABDOMINAL with bilateral salpingectomy;  Surgeon: Louretta Shorten, MD;  Location: [facility];  Service: Gynecology;  Laterality: Bilateral;  . ABDOMINOPLASTY    . ADENOIDECTOMY    . CHOLECYSTECTOMY  10/30/2012   Procedure: LAPAROSCOPIC CHOLECYSTECTOMY WITH INTRAOPERATIVE CHOLANGIOGRAM;  Surgeon: Merrie Roof, MD;  Location[facility];  Service: General;  Laterality: N/A;  . GASTRIC BYPASS  2001  . KNEE SURGERY  1993   left knee  . TONSILECTOMY, ADENOIDECTOMY, BILATERAL MYRINGOTOMY AND TUBES    . TONSILLECTOMY    . TYMPANOSTOMY TUBE PLACEMENT      There were no vitals filed for this visit.   Subjective Assessment - 03/09/21 1603    Subjective Pt states she is ready to work on her elbow today. Nothing new or different. Pt's wound has completely closed.    Patient is accompained by: Family member    How long can you sit comfortably? n/a    How long can you stand comfortably? Recently cleared for full weightbearing    How long can you walk  comfortably? Recently cleared for full weightbearing    Patient Stated Goals Pt would like to be able to safely ascend/descend steps to her room.    Currently in Pain? No/denies                   [facility] Adult PT Treatment/Exercise - 03/09/21 0001      Knee/Hip Exercises: Aerobic   Other Aerobic Sci-fit x4 min working on knee ROM            Therex:  Row 25# 2x10  Tricep press down on machine 30# 2x10  Lat pull down on L 40# x10  Elbow AROM flex/ext x10  Elbow AAROM flex/ext x10 with 10 sec hold at end range  Manual therapy:  PA/AP humeroulner and humeroradial grade III mobs for flex & extension  Grade III mobs for flex and extension  Grade III side glide mobs ulna & radius   Ulner and radial distraction  PROM end range flex & extension 5x10 sec holds        PT Short Term Goals - 03/09/21 1655      PT SHORT TERM GOAL #1   Title Pt will be independent with her HEP    Time 6    Period Weeks    Status Achieved    Target Date  03/09/21      PT SHORT TERM GOAL #2   Title Pt will be able to amb at least 150' with reciprocal gait pattern using RW for home mobility    Baseline Reciprocal gait pattern >150' with crutches, antalgic with trunk flexion using no a/d (03/09/21)    Time 6    Period Weeks    Status Achieved    Target Date 03/09/21      PT SHORT TERM GOAL #3   Title Pt will be have L ankle DF ROM to at least neutral for standing tasks    Baseline Able to come to neutral (03/09/21)    Time 6    Period Weeks    Status Achieved    Target Date 03/09/21      PT SHORT TERM GOAL #4   Title Pt will be able to safely ascend/descend 12 steps mod I to get to her upstairs bedroom    Baseline Has been performing step to pattern with use of rail (03/09/21)    Time 6    Period Weeks    Status Achieved    Target Date 03/09/21             PT Long Term Goals - 01/26/21 1142      PT LONG TERM GOAL #1   Title Pt will be independent with final HEP    Time 12     Period Weeks    Status New    Target Date 04/20/21      PT LONG TERM GOAL #2   Title Pt will be able to amb without a/d at least 400' for community mobility    Time 12    Period Weeks    Status New    Target Date 04/20/21      PT LONG TERM GOAL #3   Title Pt will be able to perform 10 full squats to demo functional LE strength    Time 12    Period Weeks    Status New    Target Date 04/20/21      PT LONG TERM GOAL #4   Title Pt will be able to ascend/descend steps independently with reciprocal gait pattern for home mobility    Time 12    Period Weeks    Status New    Target Date 04/20/21                 Plan - 03/09/21 1653    Clinical Impression Statement Treatment focused on continuing to improve L elbow ROM and strengthening. Initiated closed chain exercises. Pt has full elbow extension; flexion remains most limited. Pt continues to require cueing to increase weight shift to L LE and increase R LE step length.    Personal Factors and Comorbidities Age;Time since onset of injury/illness/exacerbation;Comorbidity 1    Comorbidities Pt with large lateral/posterior L thigh wound and L medial malleolus wound (seeing [place] wound care)    Examination-Activity Limitations Bathing;Bend;Caring for Others;Carry;Locomotion Level;Stairs;Squat;Stand;Transfers    Examination-Participation Restrictions Occupation;Cleaning;Community Activity;Driving;Yard Work    Rehab Potential Good    PT Frequency 2x / week    PT Duration 12 weeks    PT Treatment/Interventions ADLs/Self Care Home Management;Cryotherapy;Electrical Stimulation;Iontophoresis 4mg /ml Dexamethasone;Moist Heat;Ultrasound;DME Instruction;Gait training;Stair training;Functional mobility training;Therapeutic activities;Therapeutic exercise;Balance training;Neuromuscular re-education;Patient/family education;Manual techniques;Dry needling;Passive range of motion;Taping;Vasopneumatic Device;Joint Manipulations;Scar mobilization    PT  Next Visit Plan Assess response to HEP. Manual therapy and exercises for improved knee, ankle and elbow ROM. Continue strengthening L LE and  L elbow. Gait training off a/d. Consider machine strengthening on leg press and hamstring curl. Continue to progress L LE weight bearing and quad strengthening.    PT Home Exercise Plan Access Code [email or web address]    Consulted and Agree with Plan of Care Patient;Family member/caregiver    Family Member Consulted Mother           Patient will benefit from skilled therapeutic intervention in order to improve the following deficits and impairments:  Decreased balance,Decreased endurance,Decreased mobility,Difficulty walking,Hypomobility  Visit Diagnosis: Difficulty in walking, not elsewhere classified  Stiffness of left ankle, not elsewhere classified  Other abnormalities of gait and mobility  Muscle weakness (generalized)  Stiffness of left elbow, not elsewhere classified     Problem List Patient Active Problem List   Diagnosis Date Noted  . S/P TAH (total abdominal hysterectomy) 12/15/2019  . Chronic migraine 09/24/2017  . Confusion 07/23/2017  . Cholelithiasis 09/19/2012  . History of gastric bypass 09/19/2012    [facility] April Ma L Moriyah Byington PT, DPT 03/09/2021, 4:58 PM  [facility] [place], [place], [address] Phone: [phone]   Fax:  [phone]  Name: JAZIRA MALONEY MRN: [id number] Date of Birth: 04-17-1976

## 2021-03-14 ENCOUNTER — Other Ambulatory Visit: Payer: Self-pay

## 2021-03-14 ENCOUNTER — Ambulatory Visit (HOSPITAL_BASED_OUTPATIENT_CLINIC_OR_DEPARTMENT_OTHER): Payer: BC Managed Care – PPO | Admitting: Physical Therapy

## 2021-03-14 DIAGNOSIS — R262 Difficulty in walking, not elsewhere classified: Secondary | ICD-10-CM

## 2021-03-14 DIAGNOSIS — M6281 Muscle weakness (generalized): Secondary | ICD-10-CM

## 2021-03-14 DIAGNOSIS — M25622 Stiffness of left elbow, not elsewhere classified: Secondary | ICD-10-CM

## 2021-03-14 DIAGNOSIS — M25672 Stiffness of left ankle, not elsewhere classified: Secondary | ICD-10-CM

## 2021-03-14 DIAGNOSIS — R2689 Other abnormalities of gait and mobility: Secondary | ICD-10-CM

## 2021-03-14 NOTE — Therapy (Signed)
[place], [place], [address] Phone: [phone]   Fax:  [phone]  Physical Therapy Treatment  Patient Details  Name: Crystal Conner MRN: [id number] Date of Birth: [birth year] Referring Provider (PT): Rayburn Felt, MD   Encounter Date: 03/14/2021   PT End of Session - 03/14/21 1657    Visit Number 13    Number of Visits 25    Date for PT Re-Evaluation 04/20/21    Authorization Type BCBS    PT Start Time 1600    PT Stop Time 1645    PT Time Calculation (min) 45 min    Activity Tolerance Patient tolerated treatment well    Behavior During Therapy [facility] for tasks assessed/performed           Past Medical History:  Diagnosis Date  . Anemia   . Arthritis   . Depression   . Fibromyalgia   . Gallstones   . Hypertension   . Seizures ([place])     Past Surgical History:  Procedure Laterality Date  . ABDOMINAL HYSTERECTOMY Bilateral 12/15/2019   Procedure: HYSTERECTOMY ABDOMINAL with bilateral salpingectomy;  Surgeon: Louretta Shorten, MD;  Location: [facility];  Service: Gynecology;  Laterality: Bilateral;  . ABDOMINOPLASTY    . ADENOIDECTOMY    . CHOLECYSTECTOMY  10/30/2012   Procedure: LAPAROSCOPIC CHOLECYSTECTOMY WITH INTRAOPERATIVE CHOLANGIOGRAM;  Surgeon: Merrie Roof, MD;  Location: [facility];  Service: General;  Laterality: N/A;  . GASTRIC BYPASS  2001  . KNEE SURGERY  1993   left knee  . TONSILECTOMY, ADENOIDECTOMY, BILATERAL MYRINGOTOMY AND TUBES    . TONSILLECTOMY    . TYMPANOSTOMY TUBE PLACEMENT      There were no vitals filed for this visit.   Subjective Assessment - 03/14/21 1602    Subjective Pt states her elbow and knee is stiff. Pt was out of the house and got her hair done. Pt was able to go out all day and was very tired.    Patient is accompained by: Family member    How long can you sit comfortably? n/a    How long can you stand comfortably? Recently cleared for full weightbearing     How long can you walk comfortably? Recently cleared for full weightbearing    Patient Stated Goals Pt would like to be able to safely ascend/descend steps to her room.    Currently in Pain? No/denies                             [facility] Adult PT Treatment/Exercise - 03/14/21 0001      Knee/Hip Exercises: Aerobic   Other Aerobic Sci-fit x4 min working on knee ROM      Knee/Hip Exercises: Machines for Strengthening   Total Gym Leg Press Double leg x10 @ 70#, x10 @ 110#; single leg 2x8 @ 110#    Other Machine Life fitness hamstring curl 2x8 @ 70#      Knee/Hip Exercises: Standing   Forward Step Up Right;Left;2 sets;10 reps;Step Height: 4"    Step Down --   Attempted, unable to perform without compensation   Other Standing Knee Exercises staggered eccentric sit<>stand 2x10      Knee/Hip Exercises: Prone   Hamstring Curl 10 reps    Hamstring Curl Limitations With overpressure/PNF stretch      Manual Therapy   Passive ROM L knee flexion with PNF in prone  PT Short Term Goals - 03/09/21 1655      PT SHORT TERM GOAL #1   Title Pt will be independent with her HEP    Time 6    Period Weeks    Status Achieved    Target Date 03/09/21      PT SHORT TERM GOAL #2   Title Pt will be able to amb at least 150' with reciprocal gait pattern using RW for home mobility    Baseline Reciprocal gait pattern >150' with crutches, antalgic with trunk flexion using no a/d (03/09/21)    Time 6    Period Weeks    Status Achieved    Target Date 03/09/21      PT SHORT TERM GOAL #3   Title Pt will be have L ankle DF ROM to at least neutral for standing tasks    Baseline Able to come to neutral (03/09/21)    Time 6    Period Weeks    Status Achieved    Target Date 03/09/21      PT SHORT TERM GOAL #4   Title Pt will be able to safely ascend/descend 12 steps mod I to get to her upstairs bedroom    Baseline Has been performing step to pattern with use of  rail (03/09/21)    Time 6    Period Weeks    Status Achieved    Target Date 03/09/21             PT Long Term Goals - 01/26/21 1142      PT LONG TERM GOAL #1   Title Pt will be independent with final HEP    Time 12    Period Weeks    Status New    Target Date 04/20/21      PT LONG TERM GOAL #2   Title Pt will be able to amb without a/d at least 400' for community mobility    Time 12    Period Weeks    Status New    Target Date 04/20/21      PT LONG TERM GOAL #3   Title Pt will be able to perform 10 full squats to demo functional LE strength    Time 12    Period Weeks    Status New    Target Date 04/20/21      PT LONG TERM GOAL #4   Title Pt will be able to ascend/descend steps independently with reciprocal gait pattern for home mobility    Time 12    Period Weeks    Status New    Target Date 04/20/21                 Plan - 03/14/21 1656    Clinical Impression Statement Treatment focused on improving L knee motion, strength, and stabilization. Initiated balance/proprioception exercises. Pt only able to perform SLS for ~20 sec on L LE. SLR with no extension lag; however, does not have enough stability/control to perform step down exercise without compensation.    Personal Factors and Comorbidities Age;Time since onset of injury/illness/exacerbation;Comorbidity 1    Comorbidities Pt with large lateral/posterior L thigh wound and L medial malleolus wound (seeing [place] wound care)    Examination-Activity Limitations Bathing;Bend;Caring for Others;Carry;Locomotion Level;Stairs;Squat;Stand;Transfers    Examination-Participation Restrictions Occupation;Cleaning;Community Activity;Driving;Yard Work    Rehab Potential Good    PT Frequency 2x / week    PT Duration 12 weeks    PT Treatment/Interventions ADLs/Self Care Home Management;Cryotherapy;Electrical Stimulation;Iontophoresis 4mg /ml  Dexamethasone;Moist Heat;Ultrasound;DME Instruction;Gait training;Stair  training;Functional mobility training;Therapeutic activities;Therapeutic exercise;Balance training;Neuromuscular re-education;Patient/family education;Manual techniques;Dry needling;Passive range of motion;Taping;Vasopneumatic Device;Joint Manipulations;Scar mobilization    PT Next Visit Plan Assess response to HEP. Manual therapy and exercises for improved knee, ankle and elbow ROM. Continue strengthening L LE and L elbow. Gait training off a/d as able. Consider machine strengthening on leg press and hamstring curl. Continue to progress L LE weight bearing and quad strengthening.    PT Home Exercise Plan Access Code [email or web address]    Consulted and Agree with Plan of Care Patient;Family member/caregiver    Family Member Consulted Mother           Patient will benefit from skilled therapeutic intervention in order to improve the following deficits and impairments:  Decreased balance,Decreased endurance,Decreased mobility,Difficulty walking,Hypomobility  Visit Diagnosis: Difficulty in walking, not elsewhere classified  Stiffness of left ankle, not elsewhere classified  Other abnormalities of gait and mobility  Muscle weakness (generalized)  Stiffness of left elbow, not elsewhere classified     Problem List Patient Active Problem List   Diagnosis Date Noted  . S/P TAH (total abdominal hysterectomy) 12/15/2019  . Chronic migraine 09/24/2017  . Confusion 07/23/2017  . Cholelithiasis 09/19/2012  . History of gastric bypass 09/19/2012    [facility] April Ma L Churchill Grimsley PT, DPT 03/14/2021, 4:58 PM  [facility] [place], [place], [address] Phone: [phone]   Fax:  [phone]  Name: Crystal Conner MRN: [id number] Date of Birth: [birth year]

## 2021-03-16 ENCOUNTER — Ambulatory Visit (HOSPITAL_BASED_OUTPATIENT_CLINIC_OR_DEPARTMENT_OTHER): Payer: BC Managed Care – PPO | Admitting: Physical Therapy

## 2021-03-16 ENCOUNTER — Other Ambulatory Visit: Payer: Self-pay

## 2021-03-16 DIAGNOSIS — R2689 Other abnormalities of gait and mobility: Secondary | ICD-10-CM

## 2021-03-16 DIAGNOSIS — M6281 Muscle weakness (generalized): Secondary | ICD-10-CM

## 2021-03-16 DIAGNOSIS — M25622 Stiffness of left elbow, not elsewhere classified: Secondary | ICD-10-CM

## 2021-03-16 DIAGNOSIS — M25672 Stiffness of left ankle, not elsewhere classified: Secondary | ICD-10-CM

## 2021-03-16 DIAGNOSIS — R262 Difficulty in walking, not elsewhere classified: Secondary | ICD-10-CM | POA: Diagnosis not present

## 2021-03-16 NOTE — Therapy (Signed)
[place], [place], [address] Phone: [phone]   Fax:  [phone]  Physical Therapy Treatment  Patient Details  Name: Crystal Conner MRN: [id number] Date of Birth: 09-04-1976 Referring Provider (PT): Rayburn Felt, MD   Encounter Date: 03/16/2021   PT End of Session - 03/16/21 1653    Visit Number 14    Number of Visits 25    Date for PT Re-Evaluation 04/20/21    Authorization Type BCBS    PT Start Time 1600    PT Stop Time 1645    PT Time Calculation (min) 45 min    Activity Tolerance Patient tolerated treatment well    Behavior During Therapy [facility] for tasks assessed/performed           Past Medical History:  Diagnosis Date  . Anemia   . Arthritis   . Depression   . Fibromyalgia   . Gallstones   . Hypertension   . Seizures ([place])     Past Surgical History:  Procedure Laterality Date  . ABDOMINAL HYSTERECTOMY Bilateral 12/15/2019   Procedure: HYSTERECTOMY ABDOMINAL with bilateral salpingectomy;  Surgeon: Louretta Shorten, MD;  Location: [place];  Service: Gynecology;  Laterality: Bilateral;  . ABDOMINOPLASTY    . ADENOIDECTOMY    . CHOLECYSTECTOMY  10/30/2012   Procedure: LAPAROSCOPIC CHOLECYSTECTOMY WITH INTRAOPERATIVE CHOLANGIOGRAM;  Surgeon: Merrie Roof, MD;  Location: [facility];  Service: General;  Laterality: N/A;  . GASTRIC BYPASS  2001  . KNEE SURGERY  1993   left knee  . TONSILECTOMY, ADENOIDECTOMY, BILATERAL MYRINGOTOMY AND TUBES    . TONSILLECTOMY    . TYMPANOSTOMY TUBE PLACEMENT      There were no vitals filed for this visit.   Subjective Assessment - 03/16/21 1651    Subjective Pt states she took her pain meds to prep for elbow mobilization today.    Patient is accompained by: Family member    How long can you sit comfortably? n/a    How long can you stand comfortably? Recently cleared for full weightbearing    How long can you walk comfortably? Recently cleared for full  weightbearing    Patient Stated Goals Pt would like to be able to safely ascend/descend steps to her room.    Currently in Pain? No/denies                             [facility] Adult PT Treatment/Exercise - 03/16/21 0001      Elbow Exercises   Elbow Flexion AROM;Left;10 reps;Supine;Strengthening    Elbow Flexion Limitations With over pressure at end range    Elbow Extension AROM;10 reps;Supine;Left    Elbow Extension Limitations With over pressure at end range    Other elbow exercises Lateral trunk lean with elbow straight 30# x10; lat pull down 35# x10, modified tricep dip for extension 2x10      Manual Therapy   Joint Mobilization Humeroulner distraction 3x10 sec; humeroradial distraction 3x10 sec; proximal ulner and humeral PA/AP mobs for flex/ext; humeroulner and humeroradial flexion & extension grade III mobilization                    PT Short Term Goals - 03/09/21 1655      PT SHORT TERM GOAL #1   Title Pt will be independent with her HEP    Time 6    Period Weeks    Status Achieved  Target Date 03/09/21      PT SHORT TERM GOAL #2   Title Pt will be able to amb at least 150' with reciprocal gait pattern using RW for home mobility    Baseline Reciprocal gait pattern >150' with crutches, antalgic with trunk flexion using no a/d (03/09/21)    Time 6    Period Weeks    Status Achieved    Target Date 03/09/21      PT SHORT TERM GOAL #3   Title Pt will be have L ankle DF ROM to at least neutral for standing tasks    Baseline Able to come to neutral (03/09/21)    Time 6    Period Weeks    Status Achieved    Target Date 03/09/21      PT SHORT TERM GOAL #4   Title Pt will be able to safely ascend/descend 12 steps mod I to get to her upstairs bedroom    Baseline Has been performing step to pattern with use of rail (03/09/21)    Time 6    Period Weeks    Status Achieved    Target Date 03/09/21             PT Long Term Goals - 01/26/21 1142       PT LONG TERM GOAL #1   Title Pt will be independent with final HEP    Time 12    Period Weeks    Status New    Target Date 04/20/21      PT LONG TERM GOAL #2   Title Pt will be able to amb without a/d at least 400' for community mobility    Time 12    Period Weeks    Status New    Target Date 04/20/21      PT LONG TERM GOAL #3   Title Pt will be able to perform 10 full squats to demo functional LE strength    Time 12    Period Weeks    Status New    Target Date 04/20/21      PT LONG TERM GOAL #4   Title Pt will be able to ascend/descend steps independently with reciprocal gait pattern for home mobility    Time 12    Period Weeks    Status New    Target Date 04/20/21                 Plan - 03/16/21 1651    Clinical Impression Statement Treatment focused on upper body and L elbow motion. Pt able to obtain full elbow extension after prolonged stretching, flexion limited. Discussed pt's tendency to lead with elbow flexion + internal rotation & pronation during arm swing vs swinging hand first with forearm in neutral position.    Personal Factors and Comorbidities Age;Time since onset of injury/illness/exacerbation;Comorbidity 1    Comorbidities Pt with large lateral/posterior L thigh wound and L medial malleolus wound (seeing [place] wound care)    Examination-Activity Limitations Bathing;Bend;Caring for Others;Carry;Locomotion Level;Stairs;Squat;Stand;Transfers    Examination-Participation Restrictions Occupation;Cleaning;Community Activity;Driving;Yard Work    Rehab Potential Good    PT Frequency 2x / week    PT Duration 12 weeks    PT Treatment/Interventions ADLs/Self Care Home Management;Cryotherapy;Electrical Stimulation;Iontophoresis 4mg /ml Dexamethasone;Moist Heat;Ultrasound;DME Instruction;Gait training;Stair training;Functional mobility training;Therapeutic activities;Therapeutic exercise;Balance training;Neuromuscular re-education;Patient/family education;Manual  techniques;Dry needling;Passive range of motion;Taping;Vasopneumatic Device;Joint Manipulations;Scar mobilization    PT Next Visit Plan Assess response to HEP. Manual therapy and exercises for improved knee, ankle and elbow  ROM. Continue strengthening L LE and L elbow. Gait training off a/d as able. Consider machine strengthening on leg press and hamstring curl. Continue to progress L LE weight bearing and quad strengthening.    PT Home Exercise Plan Access Code [email or web address]    Consulted and Agree with Plan of Care Patient;Family member/caregiver    Family Member Consulted Mother           Patient will benefit from skilled therapeutic intervention in order to improve the following deficits and impairments:  Decreased balance,Decreased endurance,Decreased mobility,Difficulty walking,Hypomobility  Visit Diagnosis: Difficulty in walking, not elsewhere classified  Stiffness of left ankle, not elsewhere classified  Other abnormalities of gait and mobility  Muscle weakness (generalized)  Stiffness of left elbow, not elsewhere classified     Problem List Patient Active Problem List   Diagnosis Date Noted  . S/P TAH (total abdominal hysterectomy) 12/15/2019  . Chronic migraine 09/24/2017  . Confusion 07/23/2017  . Cholelithiasis 09/19/2012  . History of gastric bypass 09/19/2012    [facility] April Ma L Kyilee Gregg PT, DPT 03/16/2021, 4:56 PM  [facility] [address] [place], [place], [address] Phone: [phone]   Fax:  [phone]  Name: EVAMARIA DETORE MRN: [id number] Date of Birth: 04/19/1976

## 2021-03-21 ENCOUNTER — Ambulatory Visit (HOSPITAL_BASED_OUTPATIENT_CLINIC_OR_DEPARTMENT_OTHER): Payer: BC Managed Care – PPO | Admitting: Physical Therapy

## 2021-03-21 ENCOUNTER — Other Ambulatory Visit: Payer: Self-pay

## 2021-03-21 DIAGNOSIS — R262 Difficulty in walking, not elsewhere classified: Secondary | ICD-10-CM | POA: Diagnosis not present

## 2021-03-21 DIAGNOSIS — M25622 Stiffness of left elbow, not elsewhere classified: Secondary | ICD-10-CM

## 2021-03-21 DIAGNOSIS — M25672 Stiffness of left ankle, not elsewhere classified: Secondary | ICD-10-CM

## 2021-03-21 DIAGNOSIS — M6281 Muscle weakness (generalized): Secondary | ICD-10-CM

## 2021-03-21 DIAGNOSIS — R2689 Other abnormalities of gait and mobility: Secondary | ICD-10-CM

## 2021-03-21 NOTE — Therapy (Signed)
[place], [place], [address] Phone: [phone]   Fax:  [phone]  Physical Therapy Treatment  Patient Details  Name: Crystal Conner MRN: [id number] Date of Birth: 11-08-1976 Referring Provider (PT): Rayburn Felt, MD   Encounter Date: 03/21/2021   PT End of Session - 03/21/21 1552    Visit Number 15    Number of Visits 25    Date for PT Re-Evaluation 04/20/21    Authorization Type BCBS    PT Start Time 1552    PT Stop Time 1635    PT Time Calculation (min) 43 min    Activity Tolerance Patient tolerated treatment well    Behavior During Therapy [facility] for tasks assessed/performed           Past Medical History:  Diagnosis Date  . Anemia   . Arthritis   . Depression   . Fibromyalgia   . Gallstones   . Hypertension   . Seizures ([place])     Past Surgical History:  Procedure Laterality Date  . ABDOMINAL HYSTERECTOMY Bilateral 12/15/2019   Procedure: HYSTERECTOMY ABDOMINAL with bilateral salpingectomy;  Surgeon: Louretta Shorten, MD;  Location: [place];  Service: Gynecology;  Laterality: Bilateral;  . ABDOMINOPLASTY    . ADENOIDECTOMY    . CHOLECYSTECTOMY  10/30/2012   Procedure: LAPAROSCOPIC CHOLECYSTECTOMY WITH INTRAOPERATIVE CHOLANGIOGRAM;  Surgeon: Merrie Roof, MD;  Location[facility];  Service: General;  Laterality: N/A;  . GASTRIC BYPASS  2001  . KNEE SURGERY  1993   left knee  . TONSILECTOMY, ADENOIDECTOMY, BILATERAL MYRINGOTOMY AND TUBES    . TONSILLECTOMY    . TYMPANOSTOMY TUBE PLACEMENT      There were no vitals filed for this visit.   Subjective Assessment - 03/21/21 1555    Subjective Pt states that she saw Dr. Kayleen Memos today. Everything is healing well based on x-rays.    Patient is accompained by: Family member    How long can you sit comfortably? n/a    How long can you stand comfortably? Recently cleared for full weightbearing    How long can you walk comfortably? Recently  cleared for full weightbearing    Patient Stated Goals Pt would like to be able to safely ascend/descend steps to her room.    Currently in Pain? No/denies                             [facility] Adult PT Treatment/Exercise - 03/21/21 0001      Ambulation/Gait   Ambulation Distance (Feet) 50 Feet    Assistive device None    Gait Pattern Step-through pattern;Decreased weight shift to left;Lateral trunk lean to left    Ambulation Surface Level;Indoor    Pre-Gait Activities L LE stance + R heel strike x10; weight shift forward/backward x10    Gait Comments Cues for glute and quad activation. Cues to reduce left lean      Knee/Hip Exercises: Aerobic   Other Aerobic Sci-fit x4 min working on knee ROM      Knee/Hip Exercises: Standing   Forward Step Up Right;Left;2 sets;10 reps;Step Height: 4"    Step Down Right;2 sets;10 reps;Step Height: 4";Hand Hold: 2    Step Down Limitations Lateral step down    SLS attempted for 10 sec    Other Standing Knee Exercises staggered eccentric sit<>stand 2x10    Other Standing Knee Exercises Right foot on 6" step balance 3x30 sec  Ankle Exercises: Stretches   Other Stretch MWM using band and 6" step x10                  PT Education - 03/21/21 1711    Education Details Discussed current visit limitation due to insurance. Asked pt to double check and follow-up with her insurance company.    Person(s) Educated Patient;Parent(s)    Methods Explanation    Comprehension Verbalized understanding            PT Short Term Goals - 03/09/21 1655      PT SHORT TERM GOAL #1   Title Pt will be independent with her HEP    Time 6    Period Weeks    Status Achieved    Target Date 03/09/21      PT SHORT TERM GOAL #2   Title Pt will be able to amb at least 150' with reciprocal gait pattern using RW for home mobility    Baseline Reciprocal gait pattern >150' with crutches, antalgic with trunk flexion using no a/d (03/09/21)    Time  6    Period Weeks    Status Achieved    Target Date 03/09/21      PT SHORT TERM GOAL #3   Title Pt will be have L ankle DF ROM to at least neutral for standing tasks    Baseline Able to come to neutral (03/09/21)    Time 6    Period Weeks    Status Achieved    Target Date 03/09/21      PT SHORT TERM GOAL #4   Title Pt will be able to safely ascend/descend 12 steps mod I to get to her upstairs bedroom    Baseline Has been performing step to pattern with use of rail (03/09/21)    Time 6    Period Weeks    Status Achieved    Target Date 03/09/21             PT Long Term Goals - 01/26/21 1142      PT LONG TERM GOAL #1   Title Pt will be independent with final HEP    Time 12    Period Weeks    Status New    Target Date 04/20/21      PT LONG TERM GOAL #2   Title Pt will be able to amb without a/d at least 400' for community mobility    Time 12    Period Weeks    Status New    Target Date 04/20/21      PT LONG TERM GOAL #3   Title Pt will be able to perform 10 full squats to demo functional LE strength    Time 12    Period Weeks    Status New    Target Date 04/20/21      PT LONG TERM GOAL #4   Title Pt will be able to ascend/descend steps independently with reciprocal gait pattern for home mobility    Time 12    Period Weeks    Status New    Target Date 04/20/21                 Plan - 03/21/21 1659    Clinical Impression Statement Treatment focused on ankle, knee, and hip ROM, strength, and stability. Continued to work on improving gait pattern.    Personal Factors and Comorbidities Age;Time since onset of injury/illness/exacerbation;Comorbidity 1    Comorbidities Pt with large  lateral/posterior L thigh wound and L medial malleolus wound (seeing [place] wound care)    Examination-Activity Limitations Bathing;Bend;Caring for Others;Carry;Locomotion Level;Stairs;Squat;Stand;Transfers    Examination-Participation Restrictions Occupation;Cleaning;Community  Activity;Driving;Yard Work    Rehab Potential Good    PT Frequency 2x / week    PT Duration 12 weeks    PT Treatment/Interventions ADLs/Self Care Home Management;Cryotherapy;Electrical Stimulation;Iontophoresis 4mg /ml Dexamethasone;Moist Heat;Ultrasound;DME Instruction;Gait training;Stair training;Functional mobility training;Therapeutic activities;Therapeutic exercise;Balance training;Neuromuscular re-education;Patient/family education;Manual techniques;Dry needling;Passive range of motion;Taping;Vasopneumatic Device;Joint Manipulations;Scar mobilization    PT Next Visit Plan Assess response to HEP. Manual therapy and exercises for improved knee, ankle and elbow ROM. Continue strengthening L LE and L elbow. Gait training off a/d as able. Consider machine strengthening. Continue to progress L LE weight bearing and quad/glute strengthening.    PT Home Exercise Plan Access Code [email or web address]    Consulted and Agree with Plan of Care Patient;Family member/caregiver    Family Member Consulted Mother           Patient will benefit from skilled therapeutic intervention in order to improve the following deficits and impairments:  Decreased balance,Decreased endurance,Decreased mobility,Difficulty walking,Hypomobility  Visit Diagnosis: Difficulty in walking, not elsewhere classified  Stiffness of left ankle, not elsewhere classified  Other abnormalities of gait and mobility  Muscle weakness (generalized)  Stiffness of left elbow, not elsewhere classified     Problem List Patient Active Problem List   Diagnosis Date Noted  . S/P TAH (total abdominal hysterectomy) 12/15/2019  . Chronic migraine 09/24/2017  . Confusion 07/23/2017  . Cholelithiasis 09/19/2012  . History of gastric bypass 09/19/2012    [facility] April Ma L Eryanna Regal PT, DPT 03/21/2021, 5:12 PM  [facility] [address] [place], [place], [address] Phone: [phone]   Fax:   [phone]  Name: Crystal Conner MRN: [id number] Date of Birth: 1976-11-08

## 2021-03-23 ENCOUNTER — Ambulatory Visit (HOSPITAL_BASED_OUTPATIENT_CLINIC_OR_DEPARTMENT_OTHER): Payer: BC Managed Care – PPO | Admitting: Physical Therapy

## 2021-03-23 ENCOUNTER — Other Ambulatory Visit: Payer: Self-pay

## 2021-03-23 DIAGNOSIS — M25672 Stiffness of left ankle, not elsewhere classified: Secondary | ICD-10-CM

## 2021-03-23 DIAGNOSIS — R2689 Other abnormalities of gait and mobility: Secondary | ICD-10-CM

## 2021-03-23 DIAGNOSIS — R262 Difficulty in walking, not elsewhere classified: Secondary | ICD-10-CM

## 2021-03-23 DIAGNOSIS — M25622 Stiffness of left elbow, not elsewhere classified: Secondary | ICD-10-CM

## 2021-03-23 DIAGNOSIS — M6281 Muscle weakness (generalized): Secondary | ICD-10-CM

## 2021-03-23 NOTE — Therapy (Signed)
[place], [place], [address] Phone: [phone]   Fax:  [phone]  Physical Therapy Treatment  Patient Details  Name: Crystal Conner MRN: [id number] Date of Birth: [birth year] Referring Provider (PT): Rayburn Felt, MD   Encounter Date: 03/23/2021   PT End of Session - 03/23/21 1612    Visit Number 16    Number of Visits 25    Date for PT Re-Evaluation 04/20/21    Authorization Type BCBS    PT Start Time 1605    PT Stop Time 1645    PT Time Calculation (min) 40 min    Activity Tolerance Patient tolerated treatment well    Behavior During Therapy [facility] for tasks assessed/performed           Past Medical History:  Diagnosis Date  . Anemia   . Arthritis   . Depression   . Fibromyalgia   . Gallstones   . Hypertension   . Seizures ([place])     Past Surgical History:  Procedure Laterality Date  . ABDOMINAL HYSTERECTOMY Bilateral 12/15/2019   Procedure: HYSTERECTOMY ABDOMINAL with bilateral salpingectomy;  Surgeon: Louretta Shorten, MD;  Location: [place];  Service: Gynecology;  Laterality: Bilateral;  . ABDOMINOPLASTY    . ADENOIDECTOMY    . CHOLECYSTECTOMY  10/30/2012   Procedure: LAPAROSCOPIC CHOLECYSTECTOMY WITH INTRAOPERATIVE CHOLANGIOGRAM;  Surgeon: Merrie Roof, MD;  Location: [facility];  Service: General;  Laterality: N/A;  . GASTRIC BYPASS  2001  . KNEE SURGERY  1993   left knee  . TONSILECTOMY, ADENOIDECTOMY, BILATERAL MYRINGOTOMY AND TUBES    . TONSILLECTOMY    . TYMPANOSTOMY TUBE PLACEMENT      There were no vitals filed for this visit.   Subjective Assessment - 03/23/21 1610    Subjective Pt states she got a desk pedal at home to improve on her knee ROM at home. Otherwise, nothing new or different.    Patient is accompained by: Family member    How long can you sit comfortably? n/a    How long can you stand comfortably? Recently cleared for full weightbearing    How long can you  walk comfortably? Recently cleared for full weightbearing    Patient Stated Goals Pt would like to be able to safely ascend/descend steps to her room.    Currently in Pain? No/denies                             [facility] Adult PT Treatment/Exercise - 03/23/21 0001      Elbow Exercises   Elbow Flexion AROM;Left;Supine;Strengthening;20 reps    Bar Weights/Barbell (Elbow Flexion) 3 lbs    Elbow Flexion Limitations With over pressure    Elbow Extension AROM;Supine;Left;20 reps    Bar Weights/Barbell (Elbow Extension) 4 lbs    Elbow Extension Limitations With over pressure    Forearm Pronation AROM;Strengthening;Left;10 reps    Bar Weights/Barbell (Forearm Pronation) 3 lbs    Other elbow exercises Elbow flexion stretch at end range 3x30 sec; elbow extension stretch at end range 3x30 sec      Manual Therapy   Joint Mobilization Humeroulner distraction 3x10 sec; humeroradial distraction 3x10 sec; proximal ulner and humeral PA/AP mobs for flex/ext; humeroulner and humeroradial flexion & extension grade III mobilization                    PT Short Term Goals - 03/09/21 1655  PT SHORT TERM GOAL #1   Title Pt will be independent with her HEP    Time 6    Period Weeks    Status Achieved    Target Date 03/09/21      PT SHORT TERM GOAL #2   Title Pt will be able to amb at least 150' with reciprocal gait pattern using RW for home mobility    Baseline Reciprocal gait pattern >150' with crutches, antalgic with trunk flexion using no a/d (03/09/21)    Time 6    Period Weeks    Status Achieved    Target Date 03/09/21      PT SHORT TERM GOAL #3   Title Pt will be have L ankle DF ROM to at least neutral for standing tasks    Baseline Able to come to neutral (03/09/21)    Time 6    Period Weeks    Status Achieved    Target Date 03/09/21      PT SHORT TERM GOAL #4   Title Pt will be able to safely ascend/descend 12 steps mod I to get to her upstairs bedroom     Baseline Has been performing step to pattern with use of rail (03/09/21)    Time 6    Period Weeks    Status Achieved    Target Date 03/09/21             PT Long Term Goals - 01/26/21 1142      PT LONG TERM GOAL #1   Title Pt will be independent with final HEP    Time 12    Period Weeks    Status New    Target Date 04/20/21      PT LONG TERM GOAL #2   Title Pt will be able to amb without a/d at least 400' for community mobility    Time 12    Period Weeks    Status New    Target Date 04/20/21      PT LONG TERM GOAL #3   Title Pt will be able to perform 10 full squats to demo functional LE strength    Time 12    Period Weeks    Status New    Target Date 04/20/21      PT LONG TERM GOAL #4   Title Pt will be able to ascend/descend steps independently with reciprocal gait pattern for home mobility    Time 12    Period Weeks    Status New    Target Date 04/20/21                 Plan - 03/23/21 1655    Clinical Impression Statement Session focused on L UE strengthening, ROM, and stretching. Pt with improving elbow flexion (now able to reach her mouth and behind middle of her head). Continues to have pain and stiffness. Remains most weak in her bicep.    Personal Factors and Comorbidities Age;Time since onset of injury/illness/exacerbation;Comorbidity 1    Comorbidities Pt with large lateral/posterior L thigh wound and L medial malleolus wound (seeing [place] wound care)    Examination-Activity Limitations Bathing;Bend;Caring for Others;Carry;Locomotion Level;Stairs;Squat;Stand;Transfers    Examination-Participation Restrictions Occupation;Cleaning;Community Activity;Driving;Yard Work    Rehab Potential Good    PT Frequency 2x / week    PT Duration 12 weeks    PT Treatment/Interventions ADLs/Self Care Home Management;Cryotherapy;Electrical Stimulation;Iontophoresis 4mg /ml Dexamethasone;Moist Heat;Ultrasound;DME Instruction;Gait training;Stair training;Functional mobility  training;Therapeutic activities;Therapeutic exercise;Balance training;Neuromuscular re-education;Patient/family education;Manual techniques;Dry needling;Passive range of  motion;Taping;Vasopneumatic Device;Joint Manipulations;Scar mobilization    PT Next Visit Plan Assess response to HEP. Manual therapy and exercises for improved knee, ankle and elbow ROM. Continue strengthening L LE and L elbow. Gait training off a/d as able. Consider machine strengthening. Continue to progress L LE weight bearing and quad/glute strengthening.    PT Home Exercise Plan Access Code [email or web address]    Consulted and Agree with Plan of Care Patient;Family member/caregiver    Family Member Consulted Mother           Patient will benefit from skilled therapeutic intervention in order to improve the following deficits and impairments:  Decreased balance,Decreased endurance,Decreased mobility,Difficulty walking,Hypomobility  Visit Diagnosis: Difficulty in walking, not elsewhere classified  Stiffness of left ankle, not elsewhere classified  Other abnormalities of gait and mobility  Muscle weakness (generalized)  Stiffness of left elbow, not elsewhere classified     Problem List Patient Active Problem List   Diagnosis Date Noted  . S/P TAH (total abdominal hysterectomy) 12/15/2019  . Chronic migraine 09/24/2017  . Confusion 07/23/2017  . Cholelithiasis 09/19/2012  . History of gastric bypass 09/19/2012    [facility] April Ma L Chigozie Basaldua PT, DPT 03/23/2021, 4:56 PM  [facility] [address] [place], [place], [address] Phone: [phone]   Fax:  [phone]  Name: SHYLEIGH DAUGHTRY MRN: [id number] Date of Birth: 24-Jan-1976

## 2021-03-28 ENCOUNTER — Other Ambulatory Visit: Payer: Self-pay

## 2021-03-28 ENCOUNTER — Ambulatory Visit (HOSPITAL_BASED_OUTPATIENT_CLINIC_OR_DEPARTMENT_OTHER): Payer: BC Managed Care – PPO | Admitting: Physical Therapy

## 2021-03-28 DIAGNOSIS — R262 Difficulty in walking, not elsewhere classified: Secondary | ICD-10-CM

## 2021-03-28 DIAGNOSIS — R2689 Other abnormalities of gait and mobility: Secondary | ICD-10-CM

## 2021-03-28 DIAGNOSIS — M25622 Stiffness of left elbow, not elsewhere classified: Secondary | ICD-10-CM

## 2021-03-28 DIAGNOSIS — M6281 Muscle weakness (generalized): Secondary | ICD-10-CM

## 2021-03-28 DIAGNOSIS — M25672 Stiffness of left ankle, not elsewhere classified: Secondary | ICD-10-CM

## 2021-03-29 NOTE — Therapy (Signed)
[place], [place], [address] Phone: [phone]   Fax:  [phone]  Physical Therapy Treatment  Patient Details  Name: Crystal Conner MRN: [id number] Date of Birth: 04/26/1976 Referring Provider (PT): Rayburn Felt, MD   Encounter Date: 03/28/2021   PT End of Session - 03/29/21 0837    Visit Number 17    Number of Visits 25    Date for PT Re-Evaluation 04/20/21    Authorization Type BCBS    PT Start Time 1600    PT Stop Time 1645    PT Time Calculation (min) 45 min    Activity Tolerance Patient tolerated treatment well    Behavior During Therapy [facility] for tasks assessed/performed           Past Medical History:  Diagnosis Date  . Anemia   . Arthritis   . Depression   . Fibromyalgia   . Gallstones   . Hypertension   . Seizures ([place])     Past Surgical History:  Procedure Laterality Date  . ABDOMINAL HYSTERECTOMY Bilateral 12/15/2019   Procedure: HYSTERECTOMY ABDOMINAL with bilateral salpingectomy;  Surgeon: Louretta Shorten, MD;  Location: [place];  Service: Gynecology;  Laterality: Bilateral;  . ABDOMINOPLASTY    . ADENOIDECTOMY    . CHOLECYSTECTOMY  10/30/2012   Procedure: LAPAROSCOPIC CHOLECYSTECTOMY WITH INTRAOPERATIVE CHOLANGIOGRAM;  Surgeon: Merrie Roof, MD;  Location: [facility];  Service: General;  Laterality: N/A;  . GASTRIC BYPASS  2001  . KNEE SURGERY  1993   left knee  . TONSILECTOMY, ADENOIDECTOMY, BILATERAL MYRINGOTOMY AND TUBES    . TONSILLECTOMY    . TYMPANOSTOMY TUBE PLACEMENT      There were no vitals filed for this visit.   Subjective Assessment - 03/28/21 1601    Subjective Pt states her work schedule will change in May and will need earlier appointment times. Agreeable to decreasing POC to 1x/wk after the next week.    Patient is accompained by: Family member    How long can you sit comfortably? n/a    How long can you stand comfortably? Recently cleared for full  weightbearing    How long can you walk comfortably? Recently cleared for full weightbearing    Patient Stated Goals Pt would like to be able to safely ascend/descend steps to her room.    Currently in Pain? No/denies                             [facility] Adult PT Treatment/Exercise - 03/29/21 0001      Ambulation/Gait   Ambulation Distance (Feet) 100 Feet    Assistive device None    Gait Pattern Step-through pattern;Decreased weight shift to left;Lateral trunk lean to left;Antalgic      Knee/Hip Exercises: Stretches   Knee: Self-Stretch to increase Flexion Left;3 reps;10 seconds      Knee/Hip Exercises: Standing   Forward Step Up Right;Left;2 sets;10 reps;Step Height: 4"    Other Standing Knee Exercises lateral band walk 2x20'      Knee/Hip Exercises: Sidelying   Other Sidelying Knee/Hip Exercises Sideplank 2x20 sec      Knee/Hip Exercises: Prone   Hamstring Curl 10 reps      Manual Therapy   Passive ROM L knee flexion with PNF in prone; L ankle MWM for DF      Ankle Exercises: Stretches   Other Stretch MWM using band and 6" step x10  PT Short Term Goals - 03/09/21 1655      PT SHORT TERM GOAL #1   Title Pt will be independent with her HEP    Time 6    Period Weeks    Status Achieved    Target Date 03/09/21      PT SHORT TERM GOAL #2   Title Pt will be able to amb at least 150' with reciprocal gait pattern using RW for home mobility    Baseline Reciprocal gait pattern >150' with crutches, antalgic with trunk flexion using no a/d (03/09/21)    Time 6    Period Weeks    Status Achieved    Target Date 03/09/21      PT SHORT TERM GOAL #3   Title Pt will be have L ankle DF ROM to at least neutral for standing tasks    Baseline Able to come to neutral (03/09/21)    Time 6    Period Weeks    Status Achieved    Target Date 03/09/21      PT SHORT TERM GOAL #4   Title Pt will be able to safely ascend/descend 12 steps mod I to  get to her upstairs bedroom    Baseline Has been performing step to pattern with use of rail (03/09/21)    Time 6    Period Weeks    Status Achieved    Target Date 03/09/21             PT Long Term Goals - 01/26/21 1142      PT LONG TERM GOAL #1   Title Pt will be independent with final HEP    Time 12    Period Weeks    Status New    Target Date 04/20/21      PT LONG TERM GOAL #2   Title Pt will be able to amb without a/d at least 400' for community mobility    Time 12    Period Weeks    Status New    Target Date 04/20/21      PT LONG TERM GOAL #3   Title Pt will be able to perform 10 full squats to demo functional LE strength    Time 12    Period Weeks    Status New    Target Date 04/20/21      PT LONG TERM GOAL #4   Title Pt will be able to ascend/descend steps independently with reciprocal gait pattern for home mobility    Time 12    Period Weeks    Status New    Target Date 04/20/21                 Plan - 03/29/21 0831    Clinical Impression Statement Treatment focused on reviewing lower body strengthening exercises and progressing pt as able. Pt's L knee AROM appears equal to R knee. Pt with increasing L LE strength but requires cueing to increase weightbearing during gait and activity. Pt demos continued trunk instability with amb; PT attempted to initiate side plank with pt difficulty. Pt continues to make gains towards all LTGs.    Personal Factors and Comorbidities Age;Time since onset of injury/illness/exacerbation;Comorbidity 1    Comorbidities Pt with large lateral/posterior L thigh wound and L medial malleolus wound (seeing [place] wound care)    Examination-Activity Limitations Bathing;Bend;Caring for Others;Carry;Locomotion Level;Stairs;Squat;Stand;Transfers    Examination-Participation Restrictions Occupation;Cleaning;Community Activity;Driving;Yard Work    Rehab Potential Good    PT Frequency 1x /  week    PT Duration 8 weeks    PT  Treatment/Interventions ADLs/Self Care Home Management;Cryotherapy;Electrical Stimulation;Iontophoresis 4mg /ml Dexamethasone;Moist Heat;Ultrasound;DME Instruction;Gait training;Stair training;Functional mobility training;Therapeutic activities;Therapeutic exercise;Balance training;Neuromuscular re-education;Patient/family education;Manual techniques;Dry needling;Passive range of motion;Taping;Vasopneumatic Device;Joint Manipulations;Scar mobilization    PT Next Visit Plan Please have her schedule to 1x/wk after Wednesday visit. Assess response to HEP. Mon typically "lower body" focus, Wed typically "upper body" focus. Manual therapy and exercises for improved knee, ankle and elbow ROM. Continue strengthening L LE and L elbow. Gait training off a/d as able. Consider machine strengthening. Continue to progress L LE weight bearing and quad/glute strengthening.    PT Home Exercise Plan Access Code [email or web address]    Consulted and Agree with Plan of Care Patient;Family member/caregiver    Family Member Consulted Mother           Patient will benefit from skilled therapeutic intervention in order to improve the following deficits and impairments:  Decreased balance,Decreased endurance,Decreased mobility,Difficulty walking,Hypomobility  Visit Diagnosis: Difficulty in walking, not elsewhere classified  Stiffness of left ankle, not elsewhere classified  Other abnormalities of gait and mobility  Muscle weakness (generalized)  Stiffness of left elbow, not elsewhere classified     Problem List Patient Active Problem List   Diagnosis Date Noted  . S/P TAH (total abdominal hysterectomy) 12/15/2019  . Chronic migraine 09/24/2017  . Confusion 07/23/2017  . Cholelithiasis 09/19/2012  . History of gastric bypass 09/19/2012    [facility] April Ma L Cheryll Keisler PT, DPT 03/29/2021, 8:55 AM  [facility] [place], [place], [address] Phone:  [phone]   Fax:  [phone]  Name: Crystal Conner MRN: [id number] Date of Birth: 02/15/1976

## 2021-03-30 ENCOUNTER — Encounter (HOSPITAL_BASED_OUTPATIENT_CLINIC_OR_DEPARTMENT_OTHER): Payer: Self-pay | Admitting: Physical Therapy

## 2021-03-30 ENCOUNTER — Ambulatory Visit (HOSPITAL_BASED_OUTPATIENT_CLINIC_OR_DEPARTMENT_OTHER): Payer: BC Managed Care – PPO | Admitting: Physical Therapy

## 2021-03-30 ENCOUNTER — Other Ambulatory Visit: Payer: Self-pay

## 2021-03-30 DIAGNOSIS — M25622 Stiffness of left elbow, not elsewhere classified: Secondary | ICD-10-CM

## 2021-03-30 DIAGNOSIS — R2689 Other abnormalities of gait and mobility: Secondary | ICD-10-CM

## 2021-03-30 DIAGNOSIS — M6281 Muscle weakness (generalized): Secondary | ICD-10-CM

## 2021-03-30 DIAGNOSIS — M25672 Stiffness of left ankle, not elsewhere classified: Secondary | ICD-10-CM

## 2021-03-30 DIAGNOSIS — R262 Difficulty in walking, not elsewhere classified: Secondary | ICD-10-CM | POA: Diagnosis not present

## 2021-03-31 ENCOUNTER — Encounter (HOSPITAL_BASED_OUTPATIENT_CLINIC_OR_DEPARTMENT_OTHER): Payer: Self-pay | Admitting: Physical Therapy

## 2021-03-31 NOTE — Therapy (Signed)
[place], [place], [address] Phone: [phone]   Fax:  [phone]  Physical Therapy Treatment  Patient Details  Name: Crystal Conner MRN: [id number] Date of Birth: 1976-01-06 Referring Provider (PT): Rayburn Felt, MD   Encounter Date: 03/30/2021   PT End of Session - 03/31/21 1153    Visit Number 18    Number of Visits 25    Date for PT Re-Evaluation 04/20/21    Authorization Type BCBS    PT Start Time 1630    PT Stop Time 1711    PT Time Calculation (min) 41 min    Activity Tolerance Patient tolerated treatment well    Behavior During Therapy [facility] for tasks assessed/performed           Past Medical History:  Diagnosis Date  . Anemia   . Arthritis   . Depression   . Fibromyalgia   . Gallstones   . Hypertension   . Seizures ([place])     Past Surgical History:  Procedure Laterality Date  . ABDOMINAL HYSTERECTOMY Bilateral 12/15/2019   Procedure: HYSTERECTOMY ABDOMINAL with bilateral salpingectomy;  Surgeon: Louretta Shorten, MD;  Location: [place] [facility];  Service: Gynecology;  Laterality: Bilateral;  . ABDOMINOPLASTY    . ADENOIDECTOMY    . CHOLECYSTECTOMY  10/30/2012   Procedure: LAPAROSCOPIC CHOLECYSTECTOMY WITH INTRAOPERATIVE CHOLANGIOGRAM;  Surgeon: Merrie Roof, MD;  Location[facility];  Service: General;  Laterality: N/A;  . GASTRIC BYPASS  2001  . KNEE SURGERY  1993   left knee  . TONSILECTOMY, ADENOIDECTOMY, BILATERAL MYRINGOTOMY AND TUBES    . TONSILLECTOMY    . TYMPANOSTOMY TUBE PLACEMENT      There were no vitals filed for this visit.   Subjective Assessment - 03/31/21 1150    Subjective Patient reports her elbow has been a little sore at times. Her hip is feeling better, but she still has an antalgic gait.    Patient is accompained by: Family member    How long can you sit comfortably? n/a    How long can you stand comfortably? Recently cleared for full weightbearing    How long  can you walk comfortably? Recently cleared for full weightbearing    Patient Stated Goals Pt would like to be able to safely ascend/descend steps to her room.    Currently in Pain? No/denies                             [facility] Adult PT Treatment/Exercise - 03/31/21 1215      Elbow Exercises   Elbow Flexion Limitations 3lbs 3x10    Elbow Extension Limitations red band 2x15    Forearm Supination Limitations 4lb 3x10    Forearm Pronation Limitations 4lbs 3x10    Other elbow exercises wrist extension 3x10 3lbs wrist flexion 3lbs 3x10    Other elbow exercises red band ER 2x10 red IR 2x10 red shoulder extension 2x10; row 2x10 punch 2x10 red; for all exercises min cuing; all given for HEP      Manual Therapy   Manual Therapy Passive ROM    Joint Mobilization elbow distraction with flexion; IATYM to biceps; forearm; and tricpes    Passive ROM left elbow flexion                  PT Education - 03/30/21 1605    Education Details reviewed anatomy of the elbow    Person(s) Educated  Patient    Methods Explanation;Demonstration;Tactile cues;Verbal cues    Comprehension Verbalized understanding;Returned demonstration;Verbal cues required;Tactile cues required            PT Short Term Goals - 03/09/21 1655      PT SHORT TERM GOAL #1   Title Pt will be independent with her HEP    Time 6    Period Weeks    Status Achieved    Target Date 03/09/21      PT SHORT TERM GOAL #2   Title Pt will be able to amb at least 150' with reciprocal gait pattern using RW for home mobility    Baseline Reciprocal gait pattern >150' with crutches, antalgic with trunk flexion using no a/d (03/09/21)    Time 6    Period Weeks    Status Achieved    Target Date 03/09/21      PT SHORT TERM GOAL #3   Title Pt will be have L ankle DF ROM to at least neutral for standing tasks    Baseline Able to come to neutral (03/09/21)    Time 6    Period Weeks    Status Achieved    Target Date  03/09/21      PT SHORT TERM GOAL #4   Title Pt will be able to safely ascend/descend 12 steps mod I to get to her upstairs bedroom    Baseline Has been performing step to pattern with use of rail (03/09/21)    Time 6    Period Weeks    Status Achieved    Target Date 03/09/21             PT Long Term Goals - 01/26/21 1142      PT LONG TERM GOAL #1   Title Pt will be independent with final HEP    Time 12    Period Weeks    Status New    Target Date 04/20/21      PT LONG TERM GOAL #2   Title Pt will be able to amb without a/d at least 400' for community mobility    Time 12    Period Weeks    Status New    Target Date 04/20/21      PT LONG TERM GOAL #3   Title Pt will be able to perform 10 full squats to demo functional LE strength    Time 12    Period Weeks    Status New    Target Date 04/20/21      PT LONG TERM GOAL #4   Title Pt will be able to ascend/descend steps independently with reciprocal gait pattern for home mobility    Time 12    Period Weeks    Status New    Target Date 04/20/21                 Plan - 03/30/21 1639    Clinical Impression Statement Patient had full elbow extension but limited elbbow flexion. She had mild pain with elbow fleion. Therapy gave her a band series of pussing and pulling from different angles for home for functional strengthening. Despite manual therapy she had limite imprvement in elbow flexion. She feels like there is constant pressure in her elbow bu very little actiual pain.    Personal Factors and Comorbidities Age;Time since onset of injury/illness/exacerbation;Comorbidity 1    Comorbidities Pt with large lateral/posterior L thigh wound and L medial malleolus wound (seeing [place] wound care)  Examination-Activity Limitations Bathing;Bend;Caring for Others;Carry;Locomotion Level;Stairs;Squat;Stand;Transfers    Examination-Participation Restrictions Occupation;Cleaning;Community Activity;Driving;Yard Work    Rehab  Potential Good    PT Frequency 1x / week    PT Duration 8 weeks    PT Treatment/Interventions ADLs/Self Care Home Management;Cryotherapy;Electrical Stimulation;Iontophoresis 4mg /ml Dexamethasone;Moist Heat;Ultrasound;DME Instruction;Gait training;Stair training;Functional mobility training;Therapeutic activities;Therapeutic exercise;Balance training;Neuromuscular re-education;Patient/family education;Manual techniques;Dry needling;Passive range of motion;Taping;Vasopneumatic Device;Joint Manipulations;Scar mobilization    PT Next Visit Plan Please have her schedule to 1x/wk after Wednesday visit. Assess response to HEP. Mon typically "lower body" focus, Wed typically "upper body" focus. Manual therapy and exercises for improved knee, ankle and elbow ROM. Continue strengthening L LE and L elbow. Gait training off a/d as able. Consider machine strengthening. Continue to progress L LE weight bearing and quad/glute strengthening.    PT Home Exercise Plan Access Code [email or web address]    Consulted and Agree with Plan of Care Patient;Family member/caregiver    Family Member Consulted Mother           Patient will benefit from skilled therapeutic intervention in order to improve the following deficits and impairments:  Decreased balance,Decreased endurance,Decreased mobility,Difficulty walking,Hypomobility  Visit Diagnosis: Difficulty in walking, not elsewhere classified  Stiffness of left ankle, not elsewhere classified  Other abnormalities of gait and mobility  Muscle weakness (generalized)  Stiffness of left elbow, not elsewhere classified     Problem List Patient Active Problem List   Diagnosis Date Noted  . S/P TAH (total abdominal hysterectomy) 12/15/2019  . Chronic migraine 09/24/2017  . Confusion 07/23/2017  . Cholelithiasis 09/19/2012  . History of gastric bypass 09/19/2012    Carney Living PT DPT  03/31/2021, 12:18 PM  [facility] [address] [place], [place], [address] Phone: [phone]   Fax:  [phone]  Name: Crystal Conner MRN: [id number] Date of Birth: 08/11/1976

## 2021-04-04 ENCOUNTER — Other Ambulatory Visit: Payer: Self-pay

## 2021-04-04 ENCOUNTER — Ambulatory Visit (HOSPITAL_BASED_OUTPATIENT_CLINIC_OR_DEPARTMENT_OTHER): Payer: BC Managed Care – PPO | Attending: Orthopedic Surgery | Admitting: Physical Therapy

## 2021-04-04 DIAGNOSIS — M25622 Stiffness of left elbow, not elsewhere classified: Secondary | ICD-10-CM | POA: Diagnosis present

## 2021-04-04 DIAGNOSIS — M25672 Stiffness of left ankle, not elsewhere classified: Secondary | ICD-10-CM | POA: Diagnosis present

## 2021-04-04 DIAGNOSIS — R2689 Other abnormalities of gait and mobility: Secondary | ICD-10-CM | POA: Insufficient documentation

## 2021-04-04 DIAGNOSIS — R262 Difficulty in walking, not elsewhere classified: Secondary | ICD-10-CM | POA: Diagnosis present

## 2021-04-04 DIAGNOSIS — M6281 Muscle weakness (generalized): Secondary | ICD-10-CM | POA: Diagnosis present

## 2021-04-04 NOTE — Patient Instructions (Signed)
Access Code: [email or web address] URL: [email or web address] Date: 04/04/2021 Prepared by: Carolyne Littles  Exercises Supine Elbow Flexion Extension with Dumbbell - 1 x daily - 7 x weekly - 2 sets - 10 reps Scapular Retraction with Resistance - 1 x daily - 7 x weekly - 3 sets - 10 reps Single Arm Punch with Resistance - 1 x daily - 7 x weekly - 3 sets - 10 reps Supine Elbow Extension Stretch in Supination - 1 x daily - 7 x weekly - 2 sets - 1 minute hold Seated Wrist Radial Deviation with Anchored Resistance - 1 x daily - 7 x weekly - 2 sets - 10 reps Wrist Flexion with Resistance - 1 x daily - 7 x weekly - 2 sets - 10 reps Shoulder extension with resistance - Neutral - 1 x daily - 7 x weekly - 3 sets - 10 reps Wrist Ulnar Deviation with Resistance - 1 x daily - 7 x weekly - 2 sets - 10 reps Standing Shoulder Internal Rotation with Anchored Resistance - 1 x daily - 7 x weekly - 3 sets - 10 reps Shoulder External Rotation with Anchored Resistance - 1 x daily - 7 x weekly - 3 sets - 10 reps Heel Toe Raises with Counter Support - 1 x daily - 7 x weekly - 2 sets - 10 reps Gastroc Stretch on Wall - 1 x daily - 7 x weekly - 3 sets - 10 reps Side Stepping with Resistance at Ankles - 1 x daily - 7 x weekly - 2 sets - 10 reps Reverse Band Walks - 1 x daily - 7 x weekly - 2 sets - 10 reps Step Up - 1 x daily - 7 x weekly - 3 sets - 30 sec hold Standing Ankle Dorsiflexion Stretch on Chair - 1 x daily - 7 x weekly - 1 sets - 10 reps - 5 sec hold Wall Squat - 1 x daily - 7 x weekly - 3 sets - 10 reps Standing Full Side Plank on Wall - 1 x daily - 7 x weekly - 2 sets - 20 sec hold Standing Anti-Rotation Press with Anchored Resistance - 1 x daily - 7 x weekly - 3 sets - 10 reps Seated Ankle Inversion with Resistance - 3 x daily - 7 x weekly - 10 reps - 3 sets Ankle Plantar Flexion with Resistance - 3 x daily - 7 x weekly - 10 reps - 3 sets Seated Ankle Eversion with Resistance - 3 x daily - 7 x weekly -  10 reps - 3 sets Ankle Dorsiflexion with Resistance - 1 x daily - 7 x weekly - 3 sets - 10 reps

## 2021-04-04 NOTE — Therapy (Signed)
[place], [place], [address] Phone: [phone]   Fax:  [phone]  Physical Therapy Treatment  Patient Details  Name: Crystal Conner MRN: [id number] Date of Birth: 1976/10/12 Referring Provider (PT): Rayburn Felt, MD   Encounter Date: 04/04/2021   PT End of Session - 04/04/21 0807    Visit Number 19    Number of Visits 25    Date for PT Re-Evaluation 04/20/21    Authorization Type BCBS    PT Start Time 0800    PT Stop Time 0843    PT Time Calculation (min) 43 min    Activity Tolerance Patient tolerated treatment well    Behavior During Therapy [facility] for tasks assessed/performed           Past Medical History:  Diagnosis Date  . Anemia   . Arthritis   . Depression   . Fibromyalgia   . Gallstones   . Hypertension   . Seizures ([place])     Past Surgical History:  Procedure Laterality Date  . ABDOMINAL HYSTERECTOMY Bilateral 12/15/2019   Procedure: HYSTERECTOMY ABDOMINAL with bilateral salpingectomy;  Surgeon: Louretta Shorten, MD;  Location: [place];  Service: Gynecology;  Laterality: Bilateral;  . ABDOMINOPLASTY    . ADENOIDECTOMY    . CHOLECYSTECTOMY  10/30/2012   Procedure: LAPAROSCOPIC CHOLECYSTECTOMY WITH INTRAOPERATIVE CHOLANGIOGRAM;  Surgeon: Merrie Roof, MD;  [facility];  Service: General;  Laterality: N/A;  . GASTRIC BYPASS  2001  . KNEE SURGERY  1993   left knee  . TONSILECTOMY, ADENOIDECTOMY, BILATERAL MYRINGOTOMY AND TUBES    . TONSILLECTOMY    . TYMPANOSTOMY TUBE PLACEMENT      There were no vitals filed for this visit.   Subjective Assessment - 04/04/21 0806    Subjective Patient reports her leg is tight this morning around her knee. She had no significant pain in her elbow oer the weekend.    How long can you stand comfortably? Recently cleared for full weightbearing    How long can you walk comfortably? Recently cleared for full weightbearing    Patient Stated  Goals Pt would like to be able to safely ascend/descend steps to her room.    Currently in Pain? No/denies                             [facility] Adult PT Treatment/Exercise - 04/04/21 0001      Knee/Hip Exercises: Stretches   Passive Hamstring Stretch Limitations seated: patient given for home 2x20 second hold    Quad Stretch Limitations shown ho to do thomas stretch fro home 2x20 sec hold    Other Knee/Hip Stretches lower trunk rotation 2x10      Knee/Hip Exercises: Standing   Heel Raises Limitations weight sifting with cuing for toe placement x20    Terminal Knee Extension Strengthening;10 reps;Theraband;2 sets    Theraband Level (Terminal Knee Extension) Level 3 (Green)    Forward Step Up Right;Left;2 sets;10 reps;Step Height: 4"    Other Standing Knee Exercises standing weight shift acrossed left LE      Knee/Hip Exercises: Supine   Short Arc Quad Sets Limitations 2x15    Bridges Limitations 2x10    Straight Leg Raises Limitations 2x15      Ankle Exercises: Seated   Heel Slides Limitations t-band 4 way 2x10 red band: shown how to do for home.  PT Education - 04/04/21 0806    Education Details reviewed weight shifting and gait mechanices    Person(s) Educated Patient    Methods Explanation;Demonstration;Verbal cues;Tactile cues    Comprehension Verbalized understanding;Returned demonstration;Verbal cues required            PT Short Term Goals - 03/09/21 1655      PT SHORT TERM GOAL #1   Title Pt will be independent with her HEP    Time 6    Period Weeks    Status Achieved    Target Date 03/09/21      PT SHORT TERM GOAL #2   Title Pt will be able to amb at least 150' with reciprocal gait pattern using RW for home mobility    Baseline Reciprocal gait pattern >150' with crutches, antalgic with trunk flexion using no a/d (03/09/21)    Time 6    Period Weeks    Status Achieved    Target Date 03/09/21      PT SHORT TERM GOAL #3    Title Pt will be have L ankle DF ROM to at least neutral for standing tasks    Baseline Able to come to neutral (03/09/21)    Time 6    Period Weeks    Status Achieved    Target Date 03/09/21      PT SHORT TERM GOAL #4   Title Pt will be able to safely ascend/descend 12 steps mod I to get to her upstairs bedroom    Baseline Has been performing step to pattern with use of rail (03/09/21)    Time 6    Period Weeks    Status Achieved    Target Date 03/09/21             PT Long Term Goals - 01/26/21 1142      PT LONG TERM GOAL #1   Title Pt will be independent with final HEP    Time 12    Period Weeks    Status New    Target Date 04/20/21      PT LONG TERM GOAL #2   Title Pt will be able to amb without a/d at least 400' for community mobility    Time 12    Period Weeks    Status New    Target Date 04/20/21      PT LONG TERM GOAL #3   Title Pt will be able to perform 10 full squats to demo functional LE strength    Time 12    Period Weeks    Status New    Target Date 04/20/21      PT LONG TERM GOAL #4   Title Pt will be able to ascend/descend steps independently with reciprocal gait pattern for home mobility    Time 12    Period Weeks    Status New    Target Date 04/20/21                 Plan - 04/04/21 0837    Clinical Impression Statement Patient tolerated treatment well. We went through some knee stretching that will be easy for her to do at work or if she gets a short break from work. She had no increase in pain. We worked on some standing weight shifting and heel raises. She reported increased pain. She dis not have any bse exercises yetfor her ankle so we gave a few to work on at home.    PT Home Exercise Plan  Access Code [email or web address]    Consulted and Agree with Plan of Care Patient           Patient will benefit from skilled therapeutic intervention in order to improve the following deficits and impairments:     Visit Diagnosis: Difficulty in  walking, not elsewhere classified  Stiffness of left ankle, not elsewhere classified  Other abnormalities of gait and mobility  Muscle weakness (generalized)  Stiffness of left elbow, not elsewhere classified     Problem List Patient Active Problem List   Diagnosis Date Noted  . S/P TAH (total abdominal hysterectomy) 12/15/2019  . Chronic migraine 09/24/2017  . Confusion 07/23/2017  . Cholelithiasis 09/19/2012  . History of gastric bypass 09/19/2012    Carney Living PT DPT  04/04/2021, 9:15 AM  [facility] [address] [place], [place], [address] Phone: [phone]   Fax:  [phone]  Name: Crystal Conner MRN: [id number] Date of Birth: 1976/07/10

## 2021-04-08 ENCOUNTER — Ambulatory Visit (HOSPITAL_BASED_OUTPATIENT_CLINIC_OR_DEPARTMENT_OTHER): Payer: BC Managed Care – PPO | Admitting: Physical Therapy

## 2021-04-08 ENCOUNTER — Other Ambulatory Visit: Payer: Self-pay

## 2021-04-08 ENCOUNTER — Encounter (HOSPITAL_BASED_OUTPATIENT_CLINIC_OR_DEPARTMENT_OTHER): Payer: Self-pay | Admitting: Physical Therapy

## 2021-04-08 DIAGNOSIS — R2689 Other abnormalities of gait and mobility: Secondary | ICD-10-CM

## 2021-04-08 DIAGNOSIS — R262 Difficulty in walking, not elsewhere classified: Secondary | ICD-10-CM | POA: Diagnosis not present

## 2021-04-08 DIAGNOSIS — M25622 Stiffness of left elbow, not elsewhere classified: Secondary | ICD-10-CM

## 2021-04-08 DIAGNOSIS — M25672 Stiffness of left ankle, not elsewhere classified: Secondary | ICD-10-CM

## 2021-04-08 DIAGNOSIS — M6281 Muscle weakness (generalized): Secondary | ICD-10-CM

## 2021-04-08 NOTE — Therapy (Signed)
[place], [place], [address] Phone: [phone]   Fax:  [phone]  Physical Therapy Treatment  Patient Details  Name: Crystal Conner MRN: [id number] Date of Birth: 1976-04-25 Referring Provider (PT): Rayburn Felt, MD   Encounter Date: 04/08/2021   PT End of Session - 04/08/21 0820    Visit Number 20    Number of Visits 25    Date for PT Re-Evaluation 04/20/21    Authorization Type BCBS    PT Start Time 0803    PT Stop Time 0844    PT Time Calculation (min) 41 min    Activity Tolerance Patient tolerated treatment well    Behavior During Therapy [facility] for tasks assessed/performed           Past Medical History:  Diagnosis Date  . Anemia   . Arthritis   . Depression   . Fibromyalgia   . Gallstones   . Hypertension   . Seizures ([place])     Past Surgical History:  Procedure Laterality Date  . ABDOMINAL HYSTERECTOMY Bilateral 12/15/2019   Procedure: HYSTERECTOMY ABDOMINAL with bilateral salpingectomy;  Surgeon: Louretta Shorten, MD;  Location: [place];  Service: Gynecology;  Laterality: Bilateral;  . ABDOMINOPLASTY    . ADENOIDECTOMY    . CHOLECYSTECTOMY  10/30/2012   Procedure: LAPAROSCOPIC CHOLECYSTECTOMY WITH INTRAOPERATIVE CHOLANGIOGRAM;  Surgeon: Merrie Roof, MD;  Location: [facility];  Service: General;  Laterality: N/A;  . GASTRIC BYPASS  2001  . KNEE SURGERY  1993   left knee  . TONSILECTOMY, ADENOIDECTOMY, BILATERAL MYRINGOTOMY AND TUBES    . TONSILLECTOMY    . TYMPANOSTOMY TUBE PLACEMENT      There were no vitals filed for this visit.   Subjective Assessment - 04/08/21 0818    Subjective Patient reports she has been feeling tight. Therare is no real reason for it ti is just tight in her elbow and knee area.    Patient is accompained by: Family member    How long can you sit comfortably? n/a    How long can you stand comfortably? Recently cleared for full weightbearing    How long  can you walk comfortably? Recently cleared for full weightbearing    Patient Stated Goals Pt would like to be able to safely ascend/descend steps to her room.    Currently in Pain? No/denies                             [facility] Adult PT Treatment/Exercise - 04/08/21 0001      Elbow Exercises   Bar Weights/Barbell (Elbow Flexion) 3 lbs    Elbow Flexion Limitations 3lbs 3x10    Forearm Supination Limitations 3lbs 3x10    Other elbow exercises wrist extension 3x10 3lbs wrist flexion 3lbs 3x10    Other elbow exercises red band ER 2x10 red IR 2x10 red shoulder extension 2x10; row 2x10 punch 2x10 red; for all exercises min cuing; all given for HEP      Knee/Hip Exercises: Standing   Heel Raises Limitations weight sifting with cuing for toe placement with leg forward x25 Shfting weight through x25 with minimal UE support.    Lateral Step Up Limitations x20 4 inch    Forward Step Up Limitations x20 4 inch      Manual Therapy   Manual Therapy Passive ROM    Joint Mobilization elbow distraction with flexion; IATYM to biceps; forearm; and tricpes  Passive ROM left elbow flexion                  PT Education - 04/08/21 0819    Education Details HEP and symptom mangement    Person(s) Educated Patient    Methods Explanation;Demonstration;Tactile cues    Comprehension Verbalized understanding;Returned demonstration;Verbal cues required;Tactile cues required            PT Short Term Goals - 03/09/21 1655      PT SHORT TERM GOAL #1   Title Pt will be independent with her HEP    Time 6    Period Weeks    Status Achieved    Target Date 03/09/21      PT SHORT TERM GOAL #2   Title Pt will be able to amb at least 150' with reciprocal gait pattern using RW for home mobility    Baseline Reciprocal gait pattern >150' with crutches, antalgic with trunk flexion using no a/d (03/09/21)    Time 6    Period Weeks    Status Achieved    Target Date 03/09/21      PT SHORT  TERM GOAL #3   Title Pt will be have L ankle DF ROM to at least neutral for standing tasks    Baseline Able to come to neutral (03/09/21)    Time 6    Period Weeks    Status Achieved    Target Date 03/09/21      PT SHORT TERM GOAL #4   Title Pt will be able to safely ascend/descend 12 steps mod I to get to her upstairs bedroom    Baseline Has been performing step to pattern with use of rail (03/09/21)    Time 6    Period Weeks    Status Achieved    Target Date 03/09/21             PT Long Term Goals - 01/26/21 1142      PT LONG TERM GOAL #1   Title Pt will be independent with final HEP    Time 12    Period Weeks    Status New    Target Date 04/20/21      PT LONG TERM GOAL #2   Title Pt will be able to amb without a/d at least 400' for community mobility    Time 12    Period Weeks    Status New    Target Date 04/20/21      PT LONG TERM GOAL #3   Title Pt will be able to perform 10 full squats to demo functional LE strength    Time 12    Period Weeks    Status New    Target Date 04/20/21      PT LONG TERM GOAL #4   Title Pt will be able to ascend/descend steps independently with reciprocal gait pattern for home mobility    Time 12    Period Weeks    Status New    Target Date 04/20/21                 Plan - 04/08/21 0820    Clinical Impression Statement Per visual inspection patients elbow has improved. She continues to have limited motion but she is respoding to manual therapy. She tolerated there-x well. She ahd noincreased pain with elbow exercises. Pateitns single leg stability has improved. She did well with weight shifting in the bars. She also did well with step ups. We will  continue to progress as tolerated.    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 complexity    Clinical Decision Making Moderate    Rehab Potential Good    PT Frequency 1x / week    PT Duration 8 weeks    PT Treatment/Interventions ADLs/Self Care Home Management;Cryotherapy;Electrical Stimulation;Iontophoresis 4mg /ml Dexamethasone;Moist Heat;Ultrasound;DME Instruction;Gait training;Stair training;Functional mobility training;Therapeutic activities;Therapeutic exercise;Balance training;Neuromuscular re-education;Patient/family education;Manual techniques;Dry needling;Passive range of motion;Taping;Vasopneumatic Device;Joint Manipulations;Scar mobilization    PT Next Visit Plan continue to pwork on standing stability and elbow strengthening .    PT Home Exercise Plan Access Code [email or web address]    Consulted and Agree with Plan of Care Patient    Family Member Consulted Mother           Patient will benefit from skilled therapeutic intervention in order to improve the following deficits and impairments:  Decreased balance,Decreased endurance,Decreased mobility,Difficulty walking,Hypomobility  Visit Diagnosis: Difficulty in walking, not elsewhere classified  Stiffness of left ankle, not elsewhere classified  Other abnormalities of gait and mobility  Muscle weakness (generalized)  Stiffness of left elbow, not elsewhere classified     Problem List Patient Active Problem List   Diagnosis Date Noted  . S/P TAH (total abdominal hysterectomy) 12/15/2019  . Chronic migraine 09/24/2017  . Confusion 07/23/2017  . Cholelithiasis 09/19/2012  . History of gastric bypass 09/19/2012    Carney Living PT DPT  04/08/2021, 9:07 AM  [facility] [address] [place], [place],  [address] Phone: [phone]   Fax:  [phone]  Name: Crystal Conner MRN: [id number] Date of Birth: [birth year]

## 2021-04-18 ENCOUNTER — Other Ambulatory Visit: Payer: Self-pay

## 2021-04-18 ENCOUNTER — Ambulatory Visit (HOSPITAL_BASED_OUTPATIENT_CLINIC_OR_DEPARTMENT_OTHER): Payer: BC Managed Care – PPO | Admitting: Physical Therapy

## 2021-04-18 DIAGNOSIS — R262 Difficulty in walking, not elsewhere classified: Secondary | ICD-10-CM

## 2021-04-18 DIAGNOSIS — R2689 Other abnormalities of gait and mobility: Secondary | ICD-10-CM

## 2021-04-18 DIAGNOSIS — M25672 Stiffness of left ankle, not elsewhere classified: Secondary | ICD-10-CM

## 2021-04-18 NOTE — Therapy (Signed)
[place], [place], [address] Phone: [phone]   Fax:  [phone]  Physical Therapy Treatment  Patient Details  Name: Crystal Conner MRN: [id number] Date of Birth: 08-Oct-1976 Referring Provider (PT): Rayburn Felt, MD   Encounter Date: 04/18/2021   PT End of Session - 04/18/21 0814    Visit Number 21    Number of Visits 25    Date for PT Re-Evaluation 04/20/21    Authorization Type BCBS    PT Start Time 0800    PT Stop Time 0840    PT Time Calculation (min) 40 min    Activity Tolerance Patient tolerated treatment well    Behavior During Therapy [facility] for tasks assessed/performed           Past Medical History:  Diagnosis Date  . Anemia   . Arthritis   . Depression   . Fibromyalgia   . Gallstones   . Hypertension   . Seizures ([place])     Past Surgical History:  Procedure Laterality Date  . ABDOMINAL HYSTERECTOMY Bilateral 12/15/2019   Procedure: HYSTERECTOMY ABDOMINAL with bilateral salpingectomy;  Surgeon: Louretta Shorten, MD;  Location: [place];  Service: Gynecology;  Laterality: Bilateral;  . ABDOMINOPLASTY    . ADENOIDECTOMY    . CHOLECYSTECTOMY  10/30/2012   Procedure: LAPAROSCOPIC CHOLECYSTECTOMY WITH INTRAOPERATIVE CHOLANGIOGRAM;  Surgeon: Merrie Roof, MD;  Location[facility];  Service: General;  Laterality: N/A;  . GASTRIC BYPASS  2001  . KNEE SURGERY  1993   left knee  . TONSILECTOMY, ADENOIDECTOMY, BILATERAL MYRINGOTOMY AND TUBES    . TONSILLECTOMY    . TYMPANOSTOMY TUBE PLACEMENT      There were no vitals filed for this visit.   Subjective Assessment - 04/18/21 0812    Subjective Patient has been doing more walking outside the house. She feels like her leg gets fatigued when she deos more walking outside the house.    Patient is accompained by: Family member    How long can you sit comfortably? n/a    How long can you stand comfortably? Recently cleared for full  weightbearing    How long can you walk comfortably? Recently cleared for full weightbearing    Patient Stated Goals Pt would like to be able to safely ascend/descend steps to her room.    Currently in Pain? No/denies                             [facility] Adult PT Treatment/Exercise - 04/18/21 0001      Ambulation/Gait   Gait Comments when patient shortens her stride an concentrates on her hip her gait improves significantly. She she strides too long she has more of a hitch in her gait. She was advised to practce with shorter steps and less lateral movement. She has the strength to be able to do it.      Knee/Hip Exercises: Machines for Strengthening   Total Gym Leg Press leg press 50lbs x10 60 lbs 2x10      Knee/Hip Exercises: Standing   Heel Raises Limitations weight sifting with cuing for toe placement with leg forward x25 Shfting weight through x25 with minimal UE support.    Lateral Step Up Limitations x20 4 inch    Forward Step Up Limitations x20 4 inch      Knee/Hip Exercises: Supine   Short Arc Quad Sets Limitations 2x15 1.5 lbs  Bridges Limitations 2x20    Straight Leg Raises Strengthening;Left;2 sets;10 reps                  PT Education - 04/18/21 [phone]    Education Details reviewed strengthening for the LE    Person(s) Educated Patient    Methods Explanation;Demonstration;Tactile cues;Verbal cues    Comprehension Verbalized understanding;Returned demonstration            PT Short Term Goals - 03/09/21 1655      PT SHORT TERM GOAL #1   Title Pt will be independent with her HEP    Time 6    Period Weeks    Status Achieved    Target Date 03/09/21      PT SHORT TERM GOAL #2   Title Pt will be able to amb at least 150' with reciprocal gait pattern using RW for home mobility    Baseline Reciprocal gait pattern >150' with crutches, antalgic with trunk flexion using no a/d (03/09/21)    Time 6    Period Weeks    Status Achieved    Target  Date 03/09/21      PT SHORT TERM GOAL #3   Title Pt will be have L ankle DF ROM to at least neutral for standing tasks    Baseline Able to come to neutral (03/09/21)    Time 6    Period Weeks    Status Achieved    Target Date 03/09/21      PT SHORT TERM GOAL #4   Title Pt will be able to safely ascend/descend 12 steps mod I to get to her upstairs bedroom    Baseline Has been performing step to pattern with use of rail (03/09/21)    Time 6    Period Weeks    Status Achieved    Target Date 03/09/21             PT Long Term Goals - 01/26/21 1142      PT LONG TERM GOAL #1   Title Pt will be independent with final HEP    Time 12    Period Weeks    Status New    Target Date 04/20/21      PT LONG TERM GOAL #2   Title Pt will be able to amb without a/d at least 400' for community mobility    Time 12    Period Weeks    Status New    Target Date 04/20/21      PT LONG TERM GOAL #3   Title Pt will be able to perform 10 full squats to demo functional LE strength    Time 12    Period Weeks    Status New    Target Date 04/20/21      PT LONG TERM GOAL #4   Title Pt will be able to ascend/descend steps independently with reciprocal gait pattern for home mobility    Time 12    Period Weeks    Status New    Target Date 04/20/21                 Plan - 04/18/21 0819    Clinical Impression Statement We continue to work on weight bearing and LE strengthening. She has a significant Trendelenburgh on the left. She tolerated strengthening well this morning. We will continue to advance weight bearing exercies as tolerated. 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 SHe has good strength but when she walks she streides  long on the left side and then steps too with the right. 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    Clinical Decision Making Moderate    Rehab Potential Good    PT Frequency 1x / week    PT Duration 8 weeks    PT Treatment/Interventions ADLs/Self Care Home Management;Cryotherapy;Electrical Stimulation;Iontophoresis 4mg /ml Dexamethasone;Moist Heat;Ultrasound;DME Instruction;Gait training;Stair training;Functional mobility training;Therapeutic activities;Therapeutic exercise;Balance training;Neuromuscular re-education;Patient/family education;Manual techniques;Dry needling;Passive range of motion;Taping;Vasopneumatic Device;Joint Manipulations;Scar mobilization    PT Next Visit Plan continue to pwork on standing stability and elbow strengthening .    PT Home Exercise Plan Access Code [email or web address]    Consulted and Agree with Plan of Care Patient    Family Member Consulted Mother           Patient will benefit from skilled therapeutic intervention in order to improve the following deficits and impairments:  Decreased balance,Decreased endurance,Decreased mobility,Difficulty walking,Hypomobility  Visit Diagnosis: Difficulty in walking, not elsewhere classified  Stiffness of left ankle, not elsewhere classified  Other abnormalities of gait and mobility     Problem List Patient Active Problem List   Diagnosis Date Noted  . S/P TAH (total abdominal hysterectomy) 12/15/2019  . Chronic migraine 09/24/2017  . Confusion 07/23/2017  . Cholelithiasis 09/19/2012  . History of gastric bypass 09/19/2012    Carney Living  PT DPT  04/18/2021, 11:19 AM  [facility] [address] [place], [place],  [address] Phone: [phone]   Fax:  [phone]  Name: Crystal Conner MRN: [id number] Date of Birth: October 02, 1976

## 2021-04-29 ENCOUNTER — Other Ambulatory Visit: Payer: Self-pay

## 2021-04-29 ENCOUNTER — Ambulatory Visit (HOSPITAL_BASED_OUTPATIENT_CLINIC_OR_DEPARTMENT_OTHER): Payer: BC Managed Care – PPO | Admitting: Physical Therapy

## 2021-04-29 ENCOUNTER — Encounter (HOSPITAL_BASED_OUTPATIENT_CLINIC_OR_DEPARTMENT_OTHER): Payer: Self-pay | Admitting: Physical Therapy

## 2021-04-29 DIAGNOSIS — M25622 Stiffness of left elbow, not elsewhere classified: Secondary | ICD-10-CM

## 2021-04-29 DIAGNOSIS — M6281 Muscle weakness (generalized): Secondary | ICD-10-CM

## 2021-04-29 DIAGNOSIS — R2689 Other abnormalities of gait and mobility: Secondary | ICD-10-CM

## 2021-04-29 DIAGNOSIS — R262 Difficulty in walking, not elsewhere classified: Secondary | ICD-10-CM | POA: Diagnosis not present

## 2021-04-29 DIAGNOSIS — M25672 Stiffness of left ankle, not elsewhere classified: Secondary | ICD-10-CM

## 2021-04-29 NOTE — Therapy (Signed)
[facility] [address] [place], [place], [address] Phone: [phone]   Fax:  [phone]  Physical Therapy Treatment/re-assessment   Patient Details  Name: Crystal Conner MRN: [id number] Date of Birth: 10/31/1976 Referring Provider (PT): Rayburn Felt, MD   Encounter Date: 04/29/2021   PT End of Session - 04/29/21 0817    Visit Number 22    Number of Visits 28    Date for PT Re-Evaluation 06/10/21    Authorization Type BCBS patient working to get more visits from [employer]. We will do at leat 2 more    PT Start Time 0758    PT Stop Time [phone]    PT Time Calculation (min) 44 min    Activity Tolerance Patient tolerated treatment well    Behavior During Therapy [facility] for tasks assessed/performed           Past Medical History:  Diagnosis Date  . Anemia   . Arthritis   . Depression   . Fibromyalgia   . Gallstones   . Hypertension   . Seizures ([place])     Past Surgical History:  Procedure Laterality Date  . ABDOMINAL HYSTERECTOMY Bilateral 12/15/2019   Procedure: HYSTERECTOMY ABDOMINAL with bilateral salpingectomy;  Surgeon: Louretta Shorten, MD;  Location: [place];  Service: Gynecology;  Laterality: Bilateral;  . ABDOMINOPLASTY    . ADENOIDECTOMY    . CHOLECYSTECTOMY  10/30/2012   Procedure: LAPAROSCOPIC CHOLECYSTECTOMY WITH INTRAOPERATIVE CHOLANGIOGRAM;  Surgeon: Merrie Roof, MD;  Location: [facility];  Service: General;  Laterality: N/A;  . GASTRIC BYPASS  2001  . KNEE SURGERY  1993   left knee  . TONSILECTOMY, ADENOIDECTOMY, BILATERAL MYRINGOTOMY AND TUBES    . TONSILLECTOMY    . TYMPANOSTOMY TUBE PLACEMENT      There were no vitals filed for this visit.   Subjective Assessment - 04/29/21 0815    Subjective Patient has been walking more. She is walking without the crutch. She reports her ankle has been hurting sice she has been walking without the crutch.    Patient is accompained by: Family member     How long can you stand comfortably? Recently cleared for full weightbearing    How long can you walk comfortably? Recently cleared for full weightbearing    Patient Stated Goals Pt would like to be able to safely ascend/descend steps to her room.    Currently in Pain? Yes    Pain Score 5     Pain Location Ankle    Pain Orientation Right    Pain Descriptors / Indicators Aching    Pain Type Chronic pain    Pain Onset More than a month ago    Pain Frequency Intermittent    Aggravating Factors  walking    Pain Relieving Factors rest    Effect of Pain on Daily Activities limiting pain free walking    Multiple Pain Sites No              [facility] PT Assessment - 04/29/21 0001      AROM   Left Elbow Flexion 107    Left Elbow Extension 0    Left Ankle Dorsiflexion -3    Left Ankle Plantar Flexion 30    Left Ankle Inversion 10    Left Ankle Eversion 20      Strength   Strength Assessment Site Hip;Knee    Right/Left Hip Left    Left Hip Flexion 4/5    Left Hip ABduction  4/5    Right/Left Knee Left      Palpation   Palpation comment tender to palpation around the medial maleolus           SLS right 30 sec ; left 5 seconds with UE assist and pain in the ankle               [facility] Adult PT Treatment/Exercise - 04/29/21 0001      Ambulation/Gait   Gait Comments Patient continues to have decreased single leg stance and lateral movement with left single leg stance phase. With cuing she can shorten her stride and have an imporved reciprocal gait. Patient ambualted 71' with reciprocal gait with cuing.      Self-Care   Self-Care Other Self-Care Comments    Other Self-Care Comments  reviewed test and measures and progress that she has made. Reviewed ASSO use to reduce current ankle exacerbation.      Knee/Hip Exercises: Stretches   Other Knee/Hip Stretches ankle DF; encouraged patient to use ASO only when she is walking longer distances.      Knee/Hip Exercises: Standing    Lateral Step Up Limitations x20 4 inch    Forward Step Up Limitations x20 4 inch    Other Standing Knee Exercises Step over hurdle and back with right leg x20      Knee/Hip Exercises: Supine   Bridges Limitations 2x20    Straight Leg Raises Strengthening;Left;2 sets;10 reps      Manual Therapy   Joint Mobilization elbow distraction with flexion; IATYM to biceps; forearm; and tricpes    Passive ROM left elbow flexion                  PT Education - 04/29/21 0817    Education Details HEP and symptom mangement    Person(s) Educated Patient    Methods Explanation;Demonstration;Tactile cues;Verbal cues    Comprehension Verbalized understanding;Returned demonstration;Verbal cues required;Tactile cues required            PT Short Term Goals - 04/29/21 0918      PT SHORT TERM GOAL #1   Title Pt will be independent with her HEP    Baseline continues to work on ankle exercises    Time 6    Period Weeks    Status On-going    Target Date 06/10/21      PT SHORT TERM GOAL #2   Title Pt will be able to amb at least 150' with reciprocal gait pattern using RW for home mobility    Baseline still using step to without cuing but no longer using walker    Time 6    Period Weeks    Status Partially Met    Target Date 06/10/21      PT SHORT TERM GOAL #3   Title Pt will be have L ankle DF ROM to at least neutral for standing tasks    Baseline -3    Time 6    Period Weeks    Status On-going    Target Date 06/10/21      PT SHORT TERM GOAL #4   Title Pt will be able to safely ascend/descend 12 steps mod I to get to her upstairs bedroom    Baseline working on steps with rails    Time 6    Period Weeks    Status Achieved    Target Date 03/09/21             PT Long Term Goals -  04/29/21 0919      PT LONG TERM GOAL #1   Title Pt will be independent with final HEP    Baseline adding things to HEP    Time 12    Period Weeks    Status On-going    Target Date 06/10/21       PT LONG TERM GOAL #2   Title Pt will be able to amb without a/d at least 400' for community mobility    Baseline walking without crutch    Time 12    Period Weeks    Status Achieved      PT LONG TERM GOAL #3   Title Pt will be able to perform 10 full squats to demo functional LE strength    Baseline still working on squats    Time 12    Period Weeks    Status On-going      PT LONG TERM GOAL #4   Title Pt will be able to ascend/descend steps independently with reciprocal gait pattern for home mobility    Baseline still using right for stability    Time 12    Period Weeks    Status On-going    Target Date 06/10/21                 Plan - 04/29/21 0818    Clinical Impression Statement Pateitn shown ASO and were to purchase. She was advised to wear it when she knows she will be walking longer distances. She was also given ofcused ankle strengthening. We continue to work on building confidence and strength in the left leg. Overall she continues to make porgress. Her elbow flexion is limited but she hasfull extension. Per visual inspection her elbow flexion is progressing. She would benefit from further skilled therapy 1W6. Per patient the MD wrote a script to work on her ankle. There is nothing in the chart. She plans to follow up. She is lacking strength and motion in her ankle, as well as single leg stability. Overall her strength and stability is improving in standing. Her elbow is not painful but it is stiff particularly in the morning.    Personal Factors and Comorbidities Age;Time since onset of injury/illness/exacerbation;Comorbidity 1    Comorbidities Pt with large lateral/posterior L thigh wound and L medial malleolus wound (seeing [facility] wound care)    Examination-Activity Limitations Bathing;Bend;Caring for Others;Carry;Locomotion Level;Stairs;Squat;Stand;Transfers    Examination-Participation Restrictions Occupation;Cleaning;Community Activity;Driving;Yard Work     [employer] officer Evolving/Moderate complexity    Clinical Decision Making Moderate    Rehab Potential Good    PT Frequency 1x / week    PT Duration 8 weeks    PT Treatment/Interventions ADLs/Self Care Home Management;Cryotherapy;Electrical Stimulation;Iontophoresis 28m/ml Dexamethasone;Moist Heat;Ultrasound;DME Instruction;Gait training;Stair training;Functional mobility training;Therapeutic activities;Therapeutic exercise;Balance training;Neuromuscular re-education;Patient/family education;Manual techniques;Dry needling;Passive range of motion;Taping;Vasopneumatic Device;Joint Manipulations;Scar mobilization    PT Next Visit Plan continue to pwork on standing stability and elbow strengthening .    PT Home Exercise Plan Access Code [email or web address]    Consulted and Agree with Plan of Care Patient    Family Member Consulted Mother           Patient will benefit from skilled therapeutic intervention in order to improve the following deficits and impairments:  Decreased balance,Decreased endurance,Decreased mobility,Difficulty walking,Hypomobility  Visit Diagnosis: Difficulty in walking, not elsewhere classified - Plan: PT plan of care cert/re-cert  Stiffness of left ankle, not elsewhere classified - Plan: PT plan of care cert/re-cert  Other abnormalities of gait and  mobility - Plan: PT plan of care cert/re-cert  Muscle weakness (generalized) - Plan: PT plan of care cert/re-cert  Stiffness of left elbow, not elsewhere classified - Plan: PT plan of care cert/re-cert     Problem List Patient Active Problem List   Diagnosis Date Noted  . S/P TAH (total abdominal hysterectomy) 12/15/2019  . Chronic migraine 09/24/2017  . Confusion 07/23/2017  . Cholelithiasis 09/19/2012  . History of gastric bypass 09/19/2012    Carney Living 04/29/2021, 9:21 AM  [facility] [place], [place], [address] Phone:  [phone]   Fax:  [phone]  Name: TIAMARIE FURNARI MRN: [id number] Date of Birth: [birth year]

## 2021-05-10 ENCOUNTER — Ambulatory Visit (HOSPITAL_BASED_OUTPATIENT_CLINIC_OR_DEPARTMENT_OTHER): Payer: BC Managed Care – PPO | Attending: Orthopedic Surgery | Admitting: Physical Therapy

## 2021-05-10 ENCOUNTER — Encounter (HOSPITAL_BASED_OUTPATIENT_CLINIC_OR_DEPARTMENT_OTHER): Payer: Self-pay | Admitting: Physical Therapy

## 2021-05-10 ENCOUNTER — Other Ambulatory Visit: Payer: Self-pay

## 2021-05-10 DIAGNOSIS — M6281 Muscle weakness (generalized): Secondary | ICD-10-CM | POA: Diagnosis present

## 2021-05-10 DIAGNOSIS — M25672 Stiffness of left ankle, not elsewhere classified: Secondary | ICD-10-CM

## 2021-05-10 DIAGNOSIS — M25622 Stiffness of left elbow, not elsewhere classified: Secondary | ICD-10-CM | POA: Diagnosis present

## 2021-05-10 DIAGNOSIS — R262 Difficulty in walking, not elsewhere classified: Secondary | ICD-10-CM | POA: Diagnosis present

## 2021-05-10 DIAGNOSIS — R2689 Other abnormalities of gait and mobility: Secondary | ICD-10-CM

## 2021-05-10 NOTE — Therapy (Signed)
[facility], [place], [address] Phone: [phone]   Fax:  [phone]  Physical Therapy Treatment  Patient Details  Name: Crystal Conner MRN: [id number] Date of Birth: Mar 01, 1976 Referring Provider (PT): Rayburn Felt, MD   Encounter Date: 05/10/2021   PT End of Session - 05/10/21 0906    Visit Number 23    Number of Visits 28    Date for PT Re-Evaluation 06/10/21    Authorization Type [employer]BS patient working to get more visits from [employer]. We will do at leat 2 more    PT Start Time 0845    PT Stop Time 0928    PT Time Calculation (min) 43 min    Activity Tolerance Patient tolerated treatment well    Behavior During Therapy [facility] for tasks assessed/performed           Past Medical History:  Diagnosis Date  . Anemia   . Arthritis   . Depression   . Fibromyalgia   . Gallstones   . Hypertension   . Seizures ([place])     Past Surgical History:  Procedure Laterality Date  . ABDOMINAL HYSTERECTOMY Bilateral 12/15/2019   Procedure: HYSTERECTOMY ABDOMINAL with bilateral salpingectomy;  Surgeon: Louretta Shorten, MD;  Location: [place] [facility];  Service: Gynecology;  Laterality: Bilateral;  . ABDOMINOPLASTY    . ADENOIDECTOMY    . CHOLECYSTECTOMY  10/30/2012   Procedure: LAPAROSCOPIC CHOLECYSTECTOMY WITH INTRAOPERATIVE CHOLANGIOGRAM;  Surgeon: Merrie Roof, MD;  Location: [facility];  Service: General;  Laterality: N/A;  . GASTRIC BYPASS  2001  . KNEE SURGERY  1993   left knee  . TONSILECTOMY, ADENOIDECTOMY, BILATERAL MYRINGOTOMY AND TUBES    . TONSILLECTOMY    . TYMPANOSTOMY TUBE PLACEMENT      There were no vitals filed for this visit.   Subjective Assessment - 05/10/21 0855    Subjective Patient reports the anke has improved. She is wearing a brace and doing her exercises. She feels like her elbow is really tight.    Patient is accompained by: Family member    How long can you sit comfortably? n/a     How long can you stand comfortably? Recently cleared for full weightbearing    How long can you walk comfortably? Recently cleared for full weightbearing    Patient Stated Goals Pt would like to be able to safely ascend/descend steps to her room.    Currently in Pain? No/denies                             [facility] Adult PT Treatment/Exercise - 05/10/21 0001      Elbow Exercises   Elbow Flexion Limitations 3lbs 2x15    Forearm Pronation Limitations 3lbs 2x15    Other elbow exercises wrist extension 3x10 3lbs wrist flexion 3lbs 3x10      Knee/Hip Exercises: Standing   Forward Step Up Limitations Step  up and over x15 4 inch stpe. Pain in the ankle. Patien did without ankle brace    Gait Training reviewed shortening her steps up a little and trying to keep her hip more stable.      Manual Therapy   Manual therapy comments ISTMY and trigger point release to bicep    Joint Mobilization elbow distraction with flexion; IATYM to biceps; forearm; and tricpes    Passive ROM left elbow flexion      Ankle Exercises: Supine   T-Band  PF x20 green; inversion x20 green                  PT Education - 05/10/21 0906    Education Details reviewed HEP and symptom mangement    Person(s) Educated Patient    Methods Explanation;Demonstration;Tactile cues;Verbal cues    Comprehension Returned demonstration;Verbal cues required;Verbalized understanding;Tactile cues required            PT Short Term Goals - 04/29/21 0918      PT SHORT TERM GOAL #1   Title Pt will be independent with her HEP    Baseline continues to work on ankle exercises    Time 6    Period Weeks    Status On-going    Target Date 06/10/21      PT SHORT TERM GOAL #2   Title Pt will be able to amb at least 150' with reciprocal gait pattern using RW for home mobility    Baseline still using step to without cuing but no longer using walker    Time 6    Period Weeks    Status Partially Met    Target  Date 06/10/21      PT SHORT TERM GOAL #3   Title Pt will be have L ankle DF ROM to at least neutral for standing tasks    Baseline -3    Time 6    Period Weeks    Status On-going    Target Date 06/10/21      PT SHORT TERM GOAL #4   Title Pt will be able to safely ascend/descend 12 steps mod I to get to her upstairs bedroom    Baseline working on steps with rails    Time 6    Period Weeks    Status Achieved    Target Date 03/09/21             PT Long Term Goals - 04/29/21 0919      PT LONG TERM GOAL #1   Title Pt will be independent with final HEP    Baseline adding things to HEP    Time 12    Period Weeks    Status On-going    Target Date 06/10/21      PT LONG TERM GOAL #2   Title Pt will be able to amb without a/d at least 400' for community mobility    Baseline walking without crutch    Time 12    Period Weeks    Status Achieved      PT LONG TERM GOAL #3   Title Pt will be able to perform 10 full squats to demo functional LE strength    Baseline still working on squats    Time 12    Period Weeks    Status On-going      PT LONG TERM GOAL #4   Title Pt will be able to ascend/descend steps independently with reciprocal gait pattern for home mobility    Baseline still using right for stability    Time 12    Period Weeks    Status On-going    Target Date 06/10/21                 Plan - 05/10/21 1510    Clinical Impression Statement Patients elbow flexion remains limited. She reports improved mobility with stretching, but the range hasn't changed much over the past few visit. She has some pain in her ankle when she was doing step overs  today. Therapy continuexs to encoruage her to shorten her strides and try to keep her hip from collapsing. She has 1 more visit apporved. We will review her whole exercise program the next time. She needs to continue to work on hip strengtheing, elbow motion, and ankle stability.    Personal Factors and Comorbidities  Age;Time since onset of injury/illness/exacerbation;Comorbidity 1    Comorbidities Pt with large lateral/posterior L thigh wound and L medial malleolus wound (seeing [facility] wound care)    Examination-Activity Limitations Bathing;Bend;Caring for Others;Carry;Locomotion Level;Stairs;Squat;Stand;Transfers    Examination-Participation Restrictions Occupation;Cleaning;Community Activity;Driving;Yard Work    [employer] officer Evolving/Moderate complexity    Clinical Decision Making Moderate    Rehab Potential Good    PT Frequency 1x / week    PT Duration 8 weeks    PT Treatment/Interventions ADLs/Self Care Home Management;Cryotherapy;Electrical Stimulation;Iontophoresis 83m/ml Dexamethasone;Moist Heat;Ultrasound;DME Instruction;Gait training;Stair training;Functional mobility training;Therapeutic activities;Therapeutic exercise;Balance training;Neuromuscular re-education;Patient/family education;Manual techniques;Dry needling;Passive range of motion;Taping;Vasopneumatic Device;Joint Manipulations;Scar mobilization    PT Next Visit Plan continue to pwork on standing stability and elbow strengthening .    PT Home Exercise Plan Access Code [email or web address]    Consulted and Agree with Plan of Care Patient    Family Member Consulted Mother           Patient will benefit from skilled therapeutic intervention in order to improve the following deficits and impairments:  Decreased balance,Decreased endurance,Decreased mobility,Difficulty walking,Hypomobility  Visit Diagnosis: Difficulty in walking, not elsewhere classified  Stiffness of left ankle, not elsewhere classified  Other abnormalities of gait and mobility  Muscle weakness (generalized)  Stiffness of left elbow, not elsewhere classified     Problem List Patient Active Problem List   Diagnosis Date Noted  . S/P TAH (total abdominal hysterectomy) 12/15/2019  . Chronic migraine 09/24/2017  . Confusion 07/23/2017  . Cholelithiasis  09/19/2012  . History of gastric bypass 09/19/2012    DCarney LivingPT DPT  05/10/2021, 3:14 PM  [facility] [address]Phone: [phone]  Fax:  [phone] Name: LADDA STOKESMRN: [id number] of Birth: [birth year]

## 2021-05-17 ENCOUNTER — Ambulatory Visit (HOSPITAL_BASED_OUTPATIENT_CLINIC_OR_DEPARTMENT_OTHER): Payer: BC Managed Care – PPO | Admitting: Physical Therapy

## 2021-05-23 ENCOUNTER — Encounter (HOSPITAL_BASED_OUTPATIENT_CLINIC_OR_DEPARTMENT_OTHER): Payer: Self-pay | Admitting: Physical Therapy

## 2021-05-23 ENCOUNTER — Ambulatory Visit (HOSPITAL_BASED_OUTPATIENT_CLINIC_OR_DEPARTMENT_OTHER): Payer: BC Managed Care – PPO | Admitting: Physical Therapy

## 2021-05-23 ENCOUNTER — Other Ambulatory Visit: Payer: Self-pay

## 2021-05-23 DIAGNOSIS — M25622 Stiffness of left elbow, not elsewhere classified: Secondary | ICD-10-CM

## 2021-05-23 DIAGNOSIS — M6281 Muscle weakness (generalized): Secondary | ICD-10-CM

## 2021-05-23 DIAGNOSIS — M25672 Stiffness of left ankle, not elsewhere classified: Secondary | ICD-10-CM

## 2021-05-23 DIAGNOSIS — R262 Difficulty in walking, not elsewhere classified: Secondary | ICD-10-CM | POA: Diagnosis not present

## 2021-05-23 DIAGNOSIS — R2689 Other abnormalities of gait and mobility: Secondary | ICD-10-CM

## 2021-05-23 NOTE — Therapy (Addendum)
[place], [place], [address] Phone: [phone]   Fax:  [phone]  Physical Therapy Treatment/Discharge   Patient Details  Name: Crystal Conner MRN: [id number] Date of Birth: [birth year] Referring Provider (PT): Rayburn Felt, MD   Encounter Date: 05/23/2021   PT End of Session - 05/23/21 0906     Visit Number 24    Number of Visits 28    Date for PT Re-Evaluation 06/10/21    Authorization Type [employer] patient working to get more visits from [employer]. We will do at leat 2 more    PT Start Time [phone]    PT Stop Time 0930    PT Time Calculation (min) 43 min    Activity Tolerance Patient tolerated treatment well    Behavior During Therapy WFL for tasks assessed/performed             Past Medical History:  Diagnosis Date   Anemia    Arthritis    Depression    Fibromyalgia    Gallstones    Hypertension    Seizures ([facility])     Past Surgical History:  Procedure Laterality Date   ABDOMINAL HYSTERECTOMY Bilateral 12/15/2019   Procedure: HYSTERECTOMY ABDOMINAL with bilateral salpingectomy;  Surgeon: Louretta Shorten, MD;  Location: [facility];  Service: Gynecology;  Laterality: Bilateral;   ABDOMINOPLASTY     ADENOIDECTOMY     CHOLECYSTECTOMY  10/30/2012   Procedure: LAPAROSCOPIC CHOLECYSTECTOMY WITH INTRAOPERATIVE CHOLANGIOGRAM;  Surgeon: Merrie Roof, MD;  Location: [facility];  Service: General;  Laterality: N/A;   GASTRIC BYPASS  2001   KNEE SURGERY  1993   left knee   TONSILECTOMY, ADENOIDECTOMY, BILATERAL MYRINGOTOMY AND TUBES     TONSILLECTOMY     TYMPANOSTOMY TUBE PLACEMENT      There were no vitals filed for this visit.   Subjective Assessment - 05/23/21 0905     Subjective Patient reports her elbow is stiff this morning. Her ankle hurts a little at times but she is using the brace when she is up for longer periods. She has been working on all of her exercises. She has contacted her  insurance for more visits. She feels there is some moevement going on toeards gettign a few more vists.    How long can you stand comfortably? Recently cleared for full weightbearing    How long can you walk comfortably? Recently cleared for full weightbearing    Patient Stated Goals Pt would like to be able to safely ascend/descend steps to her room.    Currently in Pain? No/denies   only when she is standing and walking                              [facility] Adult PT Treatment/Exercise - 05/23/21 0001       Knee/Hip Exercises: Standing   Heel Raises Limitations x30 with UE support    Terminal Knee Extension Strengthening;10 reps;Theraband;2 sets    Theraband Level (Terminal Knee Extension) Level 3 (Green)    Other Standing Knee Exercises standing weight shift acrossed left LE    Other Standing Knee Exercises slow march x30      Knee/Hip Exercises: Supine   Bridges Limitations 2x20    Straight Leg Raises Strengthening;Left;2 sets;10 reps    Other Supine Knee/Hip Exercises supine march 2x15 bilateral      Manual Therapy   Manual therapy comments ISTMY and trigger  point release to bicep    Joint Mobilization elbow distraction with flexion; IATYM to biceps; forearm; and tricpes    Passive ROM left elbow flexion      Ankle Exercises: Supine   T-Band PF x20 green; inversion x20 green                    PT Education - 05/23/21 0906     Education Details reviewe complete HEP    Person(s) Educated Patient    Methods Explanation;Demonstration;Tactile cues;Verbal cues    Comprehension Verbalized understanding;Returned demonstration;Verbal cues required;Tactile cues required              PT Short Term Goals - 04/29/21 0918       PT SHORT TERM GOAL #1   Title Pt will be independent with her HEP    Baseline continues to work on ankle exercises    Time 6    Period Weeks    Status On-going    Target Date 06/10/21      PT SHORT TERM GOAL #2   Title  Pt will be able to amb at least 150' with reciprocal gait pattern using RW for home mobility    Baseline still using step to without cuing but no longer using walker    Time 6    Period Weeks    Status Partially Met    Target Date 06/10/21      PT SHORT TERM GOAL #3   Title Pt will be have L ankle DF ROM to at least neutral for standing tasks    Baseline -3    Time 6    Period Weeks    Status On-going    Target Date 06/10/21      PT SHORT TERM GOAL #4   Title Pt will be able to safely ascend/descend 12 steps mod I to get to her upstairs bedroom    Baseline working on steps with rails    Time 6    Period Weeks    Status Achieved    Target Date 03/09/21               PT Long Term Goals - 04/29/21 0919       PT LONG TERM GOAL #1   Title Pt will be independent with final HEP    Baseline adding things to HEP    Time 12    Period Weeks    Status On-going    Target Date 06/10/21      PT LONG TERM GOAL #2   Title Pt will be able to amb without a/d at least 400' for community mobility    Baseline walking without crutch    Time 12    Period Weeks    Status Achieved      PT LONG TERM GOAL #3   Title Pt will be able to perform 10 full squats to demo functional LE strength    Baseline still working on squats    Time 12    Period Weeks    Status On-going      PT LONG TERM GOAL #4   Title Pt will be able to ascend/descend steps independently with reciprocal gait pattern for home mobility    Baseline still using right for stability    Time 12    Period Weeks    Status On-going    Target Date 06/10/21  Plan - 05/23/21 0907     Clinical Impression Statement Per visual inspection the patients elbow has improved. She tolerated stretching and exercises well. Therapy reviewed her whol program for her LE. She was advised to continue to work on sinlge leg stablity exercises for the left. She was advised to continue to work on these activity's if  she does not get any more visits scheduled. Therapy also reviewed her ankle exercsies.    Personal Factors and Comorbidities Age;Time since onset of injury/illness/exacerbation;Comorbidity 1    Comorbidities Pt with large lateral/posterior L thigh wound and L medial malleolus wound (seeing [facility] wound care)    Examination-Activity Limitations Bathing;Bend;Caring for Others;Carry;Locomotion Level;Stairs;Squat;Stand;Transfers    Examination-Participation Restrictions Occupation;Cleaning;Community Activity;Driving;Yard Work    [employer] Evolving/Moderate complexity    Clinical Decision Making Moderate    Rehab Potential Good    PT Frequency 1x / week    PT Duration 8 weeks    PT Treatment/Interventions ADLs/Self Care Home Management;Cryotherapy;Electrical Stimulation;Iontophoresis 39m/ml Dexamethasone;Moist Heat;Ultrasound;DME Instruction;Gait training;Stair training;Functional mobility training;Therapeutic activities;Therapeutic exercise;Balance training;Neuromuscular re-education;Patient/family education;Manual techniques;Dry needling;Passive range of motion;Taping;Vasopneumatic Device;Joint Manipulations;Scar mobilization    PT Next Visit Plan continue to pwork on standing stability and elbow strengthening .    PT Home Exercise Plan Access Code [email or web address]    Consulted and Agree with Plan of Care Patient    Family Member Consulted Mother             Patient will benefit from skilled therapeutic intervention in order to improve the following deficits and impairments:  Decreased balance, Decreased endurance, Decreased mobility, Difficulty walking, Hypomobility  Visit Diagnosis: Difficulty in walking, not elsewhere classified  Stiffness of left ankle, not elsewhere classified  Other abnormalities of gait and mobility  Muscle weakness (generalized)  Stiffness of left elbow, not elsewhere classified   PHYSICAL THERAPY DISCHARGE SUMMARY  Visits from Start of Care:  24  Current functional level related to goals / functional outcomes: Improved general mobility    Remaining deficits: Still had antalgic gait    Education / Equipment: HEP   Patient agrees to discharge. Patient goals were met. Patient is being discharged due to being pleased with the current functional level.   Problem List Patient Active Problem List   Diagnosis Date Noted   S/P TAH (total abdominal hysterectomy) 12/15/2019   Chronic migraine 09/24/2017   Confusion 07/23/2017   Cholelithiasis 09/19/2012   History of gastric bypass 09/19/2012    DCarney LivingPT DPT  05/23/2021, 1:42 PM  [facility] [place] [address]Phone: [phone]  Fax:  [phone] Name: Crystal MALHOTRAMRN: [id number] of Birth: [birth year]

## 2021-10-23 ENCOUNTER — Other Ambulatory Visit: Payer: Self-pay | Admitting: Allergy

## 2022-04-03 ENCOUNTER — Encounter (HOSPITAL_BASED_OUTPATIENT_CLINIC_OR_DEPARTMENT_OTHER): Payer: Self-pay | Admitting: Physical Therapy

## 2023-08-01 ENCOUNTER — Inpatient Hospital Stay (HOSPITAL_COMMUNITY)
Admission: EM | Admit: 2023-08-01 | Discharge: 2023-08-04 | DRG: 964 | Disposition: A | Discharge status: Home Health | Payer: No Typology Code available for payment source | Attending: Surgery | Admitting: Surgery

## 2023-08-01 ENCOUNTER — Emergency Department (HOSPITAL_COMMUNITY): Payer: No Typology Code available for payment source

## 2023-08-01 ENCOUNTER — Other Ambulatory Visit: Payer: Self-pay

## 2023-08-01 ENCOUNTER — Encounter (HOSPITAL_COMMUNITY): Payer: Self-pay

## 2023-08-01 ENCOUNTER — Observation Stay (HOSPITAL_COMMUNITY): Payer: No Typology Code available for payment source

## 2023-08-01 ENCOUNTER — Other Ambulatory Visit (HOSPITAL_COMMUNITY): Payer: Self-pay

## 2023-08-01 DIAGNOSIS — M797 Fibromyalgia: Secondary | ICD-10-CM | POA: Diagnosis present

## 2023-08-01 DIAGNOSIS — S2231XA Fracture of one rib, right side, initial encounter for closed fracture: Secondary | ICD-10-CM | POA: Diagnosis present

## 2023-08-01 DIAGNOSIS — Z9884 Bariatric surgery status: Secondary | ICD-10-CM

## 2023-08-01 DIAGNOSIS — F32A Depression, unspecified: Secondary | ICD-10-CM | POA: Diagnosis present

## 2023-08-01 DIAGNOSIS — S065XAA Traumatic subdural hemorrhage with loss of consciousness status unknown, initial encounter: Secondary | ICD-10-CM | POA: Diagnosis not present

## 2023-08-01 DIAGNOSIS — D329 Benign neoplasm of meninges, unspecified: Secondary | ICD-10-CM | POA: Diagnosis present

## 2023-08-01 DIAGNOSIS — Z9049 Acquired absence of other specified parts of digestive tract: Secondary | ICD-10-CM

## 2023-08-01 DIAGNOSIS — I10 Essential (primary) hypertension: Secondary | ICD-10-CM | POA: Diagnosis present

## 2023-08-01 DIAGNOSIS — Y92410 Unspecified street and highway as the place of occurrence of the external cause: Secondary | ICD-10-CM

## 2023-08-01 DIAGNOSIS — Z880 Allergy status to penicillin: Secondary | ICD-10-CM

## 2023-08-01 DIAGNOSIS — S36892A Contusion of other intra-abdominal organs, initial encounter: Secondary | ICD-10-CM | POA: Diagnosis present

## 2023-08-01 DIAGNOSIS — Z888 Allergy status to other drugs, medicaments and biological substances status: Secondary | ICD-10-CM

## 2023-08-01 DIAGNOSIS — Z90710 Acquired absence of both cervix and uterus: Secondary | ICD-10-CM

## 2023-08-01 DIAGNOSIS — S0181XA Laceration without foreign body of other part of head, initial encounter: Secondary | ICD-10-CM | POA: Diagnosis present

## 2023-08-01 DIAGNOSIS — Z6841 Body Mass Index (BMI) 40.0 and over, adult: Secondary | ICD-10-CM

## 2023-08-01 DIAGNOSIS — Z8042 Family history of malignant neoplasm of prostate: Secondary | ICD-10-CM

## 2023-08-01 DIAGNOSIS — S42001A Fracture of unspecified part of right clavicle, initial encounter for closed fracture: Secondary | ICD-10-CM | POA: Diagnosis present

## 2023-08-01 DIAGNOSIS — Z79899 Other long term (current) drug therapy: Secondary | ICD-10-CM

## 2023-08-01 DIAGNOSIS — Z803 Family history of malignant neoplasm of breast: Secondary | ICD-10-CM

## 2023-08-01 DIAGNOSIS — G40909 Epilepsy, unspecified, not intractable, without status epilepticus: Secondary | ICD-10-CM | POA: Diagnosis present

## 2023-08-01 DIAGNOSIS — S01511A Laceration without foreign body of lip, initial encounter: Secondary | ICD-10-CM | POA: Diagnosis present

## 2023-08-01 DIAGNOSIS — E8721 Acute metabolic acidosis: Secondary | ICD-10-CM | POA: Diagnosis present

## 2023-08-01 DIAGNOSIS — E739 Lactose intolerance, unspecified: Secondary | ICD-10-CM | POA: Diagnosis present

## 2023-08-01 LAB — I-STAT CHEM 8, ED
BUN: 15 mg/dL (ref 6–20)
Calcium, Ion: 1.22 mmol/L (ref 1.15–1.40)
Chloride: 116 mmol/L — ABNORMAL HIGH (ref 98–111)
Creatinine, Ser: 0.9 mg/dL (ref 0.44–1.00)
Glucose, Bld: 85 mg/dL (ref 70–99)
HCT: 31 % — ABNORMAL LOW (ref 36.0–46.0)
Hemoglobin: 10.5 g/dL — ABNORMAL LOW (ref 12.0–15.0)
Potassium: 4 mmol/L (ref 3.5–5.1)
Sodium: 143 mmol/L (ref 135–145)
TCO2: 15 mmol/L — ABNORMAL LOW (ref 22–32)

## 2023-08-01 LAB — CBC
HCT: 34.7 % — ABNORMAL LOW (ref 36.0–46.0)
Hemoglobin: 11.1 g/dL — ABNORMAL LOW (ref 12.0–15.0)
MCH: 29.5 pg (ref 26.0–34.0)
MCHC: 32 g/dL (ref 30.0–36.0)
MCV: 92.3 fL (ref 80.0–100.0)
Platelets: 391 10*3/uL (ref 150–400)
RBC: 3.76 MIL/uL — ABNORMAL LOW (ref 3.87–5.11)
RDW: 12.1 % (ref 11.5–15.5)
WBC: 7.6 10*3/uL (ref 4.0–10.5)
nRBC: 0 % (ref 0.0–0.2)

## 2023-08-01 LAB — URINALYSIS, ROUTINE W REFLEX MICROSCOPIC
Bacteria, UA: NONE SEEN
Bilirubin Urine: NEGATIVE
Glucose, UA: NEGATIVE mg/dL
Ketones, ur: 20 mg/dL — AB
Leukocytes,Ua: NEGATIVE
Nitrite: NEGATIVE
Protein, ur: NEGATIVE mg/dL
Specific Gravity, Urine: 1.035 — ABNORMAL HIGH (ref 1.005–1.030)
pH: 6 (ref 5.0–8.0)

## 2023-08-01 LAB — COMPREHENSIVE METABOLIC PANEL
ALT: 14 U/L (ref 0–44)
AST: 25 U/L (ref 15–41)
Albumin: 3.2 g/dL — ABNORMAL LOW (ref 3.5–5.0)
Alkaline Phosphatase: 77 U/L (ref 38–126)
Anion gap: 7 (ref 5–15)
BUN: 16 mg/dL (ref 6–20)
CO2: 16 mmol/L — ABNORMAL LOW (ref 22–32)
Calcium: 8.3 mg/dL — ABNORMAL LOW (ref 8.9–10.3)
Chloride: 116 mmol/L — ABNORMAL HIGH (ref 98–111)
Creatinine, Ser: 1.04 mg/dL — ABNORMAL HIGH (ref 0.44–1.00)
GFR, Estimated: >60 mL/min (ref >60)
Glucose, Bld: 87 mg/dL (ref 70–99)
Potassium: 3.9 mmol/L (ref 3.5–5.1)
Sodium: 139 mmol/L (ref 135–145)
Total Bilirubin: 0.4 mg/dL (ref 0.3–1.2)
Total Protein: 6.2 g/dL — ABNORMAL LOW (ref 6.5–8.1)

## 2023-08-01 LAB — MRSA NEXT GEN BY PCR, NASAL: MRSA by PCR Next Gen: NOT DETECTED

## 2023-08-01 LAB — ETHANOL: Alcohol, Ethyl (B): <10 mg/dL (ref <10)

## 2023-08-01 MED ORDER — VENLAFAXINE HCL ER 150 MG PO CP24
150.0000 mg | ORAL_CAPSULE | Freq: Every day | ORAL | Status: DC
  Filled 2023-08-01 (×2): qty 1

## 2023-08-01 MED ORDER — ONDANSETRON HCL 4 MG PO TABS: 4.0000 mg | ORAL_TABLET | ORAL | Status: DC | PRN

## 2023-08-01 MED ORDER — LORATADINE 10 MG PO TABS
10.0000 mg | ORAL_TABLET | Freq: Every day | ORAL | Status: DC
  Filled 2023-08-01 (×3): qty 1

## 2023-08-01 MED ORDER — PANTOPRAZOLE SODIUM 40 MG PO TBEC
40.0000 mg | DELAYED_RELEASE_TABLET | Freq: Every day | ORAL | Status: DC
  Administered 2023-08-01: 40 mg via ORAL
  Filled 2023-08-01 (×2): qty 1

## 2023-08-01 MED ORDER — PROMETHAZINE HCL 25 MG PO TABS: 12.5000 mg | ORAL_TABLET | ORAL | Status: DC | PRN

## 2023-08-01 MED ORDER — LABETALOL HCL 5 MG/ML IV SOLN: 10.0000 mg | INTRAVENOUS | Status: DC | PRN

## 2023-08-01 MED ORDER — LEVETIRACETAM 500 MG PO TABS
1000.0000 mg | ORAL_TABLET | Freq: Every day | ORAL | Status: DC
  Administered 2023-08-02 – 2023-08-03 (×2): 1000 mg via ORAL
  Filled 2023-08-01 (×2): qty 2

## 2023-08-01 MED ORDER — ORAL CARE MOUTH RINSE: 15.0000 mL | OROMUCOSAL | Status: DC | PRN

## 2023-08-01 MED ORDER — ONDANSETRON HCL 4 MG/2ML IJ SOLN: 4.0000 mg | INTRAMUSCULAR | Status: DC | PRN

## 2023-08-01 MED ORDER — MONTELUKAST SODIUM 10 MG PO TABS
10.0000 mg | ORAL_TABLET | Freq: Every day | ORAL | Status: DC
  Filled 2023-08-01 (×2): qty 1

## 2023-08-01 MED ORDER — TOPIRAMATE 25 MG PO TABS
150.0000 mg | ORAL_TABLET | Freq: Two times a day (BID) | ORAL | Status: DC
  Administered 2023-08-01 – 2023-08-03 (×4): 150 mg via ORAL
  Filled 2023-08-01 (×4): qty 6

## 2023-08-01 MED ORDER — ACETAMINOPHEN 500 MG PO TABS
1000.0000 mg | ORAL_TABLET | Freq: Four times a day (QID) | ORAL | Status: DC
  Administered 2023-08-03 – 2023-08-04 (×4): 1000 mg via ORAL
  Filled 2023-08-01 (×7): qty 2

## 2023-08-01 MED ORDER — PANTOPRAZOLE SODIUM 40 MG IV SOLR: 40.0000 mg | Freq: Every day | INTRAVENOUS | Status: DC

## 2023-08-01 MED ORDER — LEVETIRACETAM 500 MG PO TABS: 1000.0000 mg | ORAL_TABLET | ORAL | Status: DC

## 2023-08-01 MED ORDER — ONDANSETRON HCL 4 MG/2ML IJ SOLN
4.0000 mg | Freq: Once | INTRAMUSCULAR | Status: AC
  Administered 2023-08-01: 4 mg via INTRAVENOUS
  Filled 2023-08-01: qty 2

## 2023-08-01 MED ORDER — IRBESARTAN 150 MG PO TABS
150.0000 mg | ORAL_TABLET | Freq: Every day | ORAL | Status: DC
  Administered 2023-08-02 – 2023-08-04 (×3): 150 mg via ORAL
  Filled 2023-08-01 (×3): qty 1

## 2023-08-01 MED ORDER — AZELASTINE HCL 0.1 % NA SOLN: 2.0000 | Freq: Two times a day (BID) | NASAL | Status: DC | PRN

## 2023-08-01 MED ORDER — POTASSIUM CHLORIDE IN NACL 20-0.9 MEQ/L-% IV SOLN
INTRAVENOUS | Status: DC
  Filled 2023-08-01: qty 1000

## 2023-08-01 MED ORDER — IPRATROPIUM BROMIDE 0.06 % NA SOLN: 2.0000 | Freq: Four times a day (QID) | NASAL | Status: DC | PRN

## 2023-08-01 MED ORDER — SENNA 8.6 MG PO TABS
1.0000 | ORAL_TABLET | Freq: Two times a day (BID) | ORAL | Status: DC
  Administered 2023-08-01 – 2023-08-04 (×3): 8.6 mg via ORAL
  Filled 2023-08-01 (×5): qty 1

## 2023-08-01 MED ORDER — SODIUM CHLORIDE 0.9 % IV BOLUS
1000.0000 mL | Freq: Once | INTRAVENOUS | Status: AC
  Administered 2023-08-01: 1000 mL via INTRAVENOUS

## 2023-08-01 MED ORDER — MORPHINE SULFATE (PF) 4 MG/ML IV SOLN
4.0000 mg | Freq: Once | INTRAVENOUS | Status: AC
  Administered 2023-08-01: 4 mg via INTRAVENOUS
  Filled 2023-08-01: qty 1

## 2023-08-01 MED ORDER — CHLORHEXIDINE GLUCONATE CLOTH 2 % EX PADS
6.0000 | MEDICATED_PAD | Freq: Every day | CUTANEOUS | Status: DC
  Administered 2023-08-01 – 2023-08-04 (×4): 6 via TOPICAL

## 2023-08-01 MED ORDER — OXYCODONE HCL 5 MG PO TABS
5.0000 mg | ORAL_TABLET | ORAL | Status: DC | PRN
  Administered 2023-08-02 (×3): 5 mg via ORAL
  Administered 2023-08-02: 10 mg via ORAL
  Administered 2023-08-02: 5 mg via ORAL
  Administered 2023-08-03 – 2023-08-04 (×2): 10 mg via ORAL
  Filled 2023-08-01 (×3): qty 2
  Filled 2023-08-01 (×4): qty 1

## 2023-08-01 MED ORDER — LEVETIRACETAM 750 MG PO TABS
1500.0000 mg | ORAL_TABLET | Freq: Every day | ORAL | Status: DC
  Administered 2023-08-01 – 2023-08-02 (×2): 1500 mg via ORAL
  Filled 2023-08-01 (×2): qty 2

## 2023-08-01 MED ORDER — ACETAMINOPHEN 325 MG PO TABS
650.0000 mg | ORAL_TABLET | ORAL | Status: DC | PRN
  Administered 2023-08-02 – 2023-08-03 (×4): 650 mg via ORAL
  Filled 2023-08-01 (×4): qty 2

## 2023-08-01 MED ORDER — SODIUM CHLORIDE 0.9 % IV SOLN: INTRAVENOUS | Status: DC

## 2023-08-01 MED ORDER — ACETAMINOPHEN 650 MG RE SUPP: 650.0000 mg | RECTAL | Status: DC | PRN

## 2023-08-01 MED ORDER — DICYCLOMINE HCL 20 MG PO TABS: 20.0000 mg | ORAL_TABLET | Freq: Three times a day (TID) | ORAL | Status: DC | PRN

## 2023-08-01 MED ORDER — POTASSIUM CHLORIDE IN NACL 20-0.9 MEQ/L-% IV SOLN
INTRAVENOUS | Status: DC
  Filled 2023-08-01 (×3): qty 1000

## 2023-08-01 MED ORDER — LIDOCAINE VISCOUS HCL 2 % MT SOLN
15.0000 mL | Freq: Once | OROMUCOSAL | Status: AC
  Administered 2023-08-01: 15 mL via OROMUCOSAL
  Filled 2023-08-01: qty 15

## 2023-08-01 MED ORDER — HYDROCODONE-ACETAMINOPHEN 5-325 MG PO TABS
1.0000 | ORAL_TABLET | ORAL | Status: DC | PRN
  Administered 2023-08-01 – 2023-08-03 (×3): 1 via ORAL
  Filled 2023-08-01 (×4): qty 1

## 2023-08-01 MED ORDER — MORPHINE SULFATE (PF) 2 MG/ML IV SOLN: 1.0000 mg | INTRAVENOUS | Status: DC | PRN

## 2023-08-01 MED ORDER — VALSARTAN-HYDROCHLOROTHIAZIDE 160-12.5 MG PO TABS: 1.0000 | ORAL_TABLET | Freq: Every day | ORAL | Status: DC

## 2023-08-01 MED ORDER — IOHEXOL 350 MG/ML SOLN
75.0000 mL | Freq: Once | INTRAVENOUS | Status: AC | PRN
  Administered 2023-08-01: 75 mL via INTRAVENOUS

## 2023-08-01 MED ORDER — NALOXONE HCL 0.4 MG/ML IJ SOLN: 0.0800 mg | INTRAMUSCULAR | Status: DC | PRN

## 2023-08-01 MED ORDER — HYDROCHLOROTHIAZIDE 12.5 MG PO TABS
12.5000 mg | ORAL_TABLET | Freq: Every day | ORAL | Status: DC
  Administered 2023-08-02: 12.5 mg via ORAL
  Filled 2023-08-01: qty 1
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
## 2023-08-01 NOTE — ED Notes (Signed)
Attempted to give report to ICU x2

## 2023-08-01 NOTE — ED Notes (Signed)
Attempted to give report to ICU x1.  

## 2023-08-01 NOTE — ED Notes (Signed)
Patient transported to CT 

## 2023-08-02 DIAGNOSIS — S01511A Laceration without foreign body of lip, initial encounter: Secondary | ICD-10-CM | POA: Diagnosis present

## 2023-08-02 DIAGNOSIS — S2231XA Fracture of one rib, right side, initial encounter for closed fracture: Secondary | ICD-10-CM | POA: Diagnosis present

## 2023-08-02 DIAGNOSIS — Y92410 Unspecified street and highway as the place of occurrence of the external cause: Secondary | ICD-10-CM | POA: Diagnosis not present

## 2023-08-02 DIAGNOSIS — S42001A Fracture of unspecified part of right clavicle, initial encounter for closed fracture: Secondary | ICD-10-CM | POA: Diagnosis present

## 2023-08-02 DIAGNOSIS — Z79899 Other long term (current) drug therapy: Secondary | ICD-10-CM | POA: Diagnosis not present

## 2023-08-02 DIAGNOSIS — S0181XA Laceration without foreign body of other part of head, initial encounter: Secondary | ICD-10-CM | POA: Diagnosis present

## 2023-08-02 DIAGNOSIS — Z9884 Bariatric surgery status: Secondary | ICD-10-CM | POA: Diagnosis not present

## 2023-08-02 DIAGNOSIS — Z8042 Family history of malignant neoplasm of prostate: Secondary | ICD-10-CM | POA: Diagnosis not present

## 2023-08-02 DIAGNOSIS — D329 Benign neoplasm of meninges, unspecified: Secondary | ICD-10-CM | POA: Diagnosis present

## 2023-08-02 DIAGNOSIS — Z9049 Acquired absence of other specified parts of digestive tract: Secondary | ICD-10-CM | POA: Diagnosis not present

## 2023-08-02 DIAGNOSIS — S36892A Contusion of other intra-abdominal organs, initial encounter: Secondary | ICD-10-CM | POA: Diagnosis present

## 2023-08-02 DIAGNOSIS — G40909 Epilepsy, unspecified, not intractable, without status epilepticus: Secondary | ICD-10-CM | POA: Diagnosis present

## 2023-08-02 DIAGNOSIS — Z880 Allergy status to penicillin: Secondary | ICD-10-CM | POA: Diagnosis not present

## 2023-08-02 DIAGNOSIS — Z90710 Acquired absence of both cervix and uterus: Secondary | ICD-10-CM | POA: Diagnosis not present

## 2023-08-02 DIAGNOSIS — Z803 Family history of malignant neoplasm of breast: Secondary | ICD-10-CM | POA: Diagnosis not present

## 2023-08-02 DIAGNOSIS — I10 Essential (primary) hypertension: Secondary | ICD-10-CM | POA: Diagnosis present

## 2023-08-02 DIAGNOSIS — E8721 Acute metabolic acidosis: Secondary | ICD-10-CM | POA: Diagnosis present

## 2023-08-02 DIAGNOSIS — Z888 Allergy status to other drugs, medicaments and biological substances status: Secondary | ICD-10-CM | POA: Diagnosis not present

## 2023-08-02 DIAGNOSIS — S065XAA Traumatic subdural hemorrhage with loss of consciousness status unknown, initial encounter: Secondary | ICD-10-CM | POA: Diagnosis present

## 2023-08-02 DIAGNOSIS — F32A Depression, unspecified: Secondary | ICD-10-CM | POA: Diagnosis present

## 2023-08-02 DIAGNOSIS — Z6841 Body Mass Index (BMI) 40.0 and over, adult: Secondary | ICD-10-CM | POA: Diagnosis not present

## 2023-08-02 DIAGNOSIS — E739 Lactose intolerance, unspecified: Secondary | ICD-10-CM | POA: Diagnosis present

## 2023-08-02 DIAGNOSIS — M797 Fibromyalgia: Secondary | ICD-10-CM | POA: Diagnosis present

## 2023-08-02 LAB — TOPIRAMATE LEVEL: Topiramate Lvl: 11.4 ug/mL (ref 2.0–25.0)

## 2023-08-02 LAB — LEVETIRACETAM LEVEL: Levetiracetam Lvl: 62.6 ug/mL — ABNORMAL HIGH (ref 10.0–40.0)
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
## 2023-08-02 NOTE — TOC CAGE-AID Note (Signed)
Transition of Care [facility]) - CAGE-AID Screening   Patient Details  Name: Crystal Conner MRN: [id number] Date of Birth: [birth year]  Transition of Care [facility]) CM/SW Contact:    Leota Sauers, RN Phone Number: 08/02/2023, 3:44 AM   Clinical Narrative:  Patient denies use of alcohol and illicit drugs. Resources not given at this time.   CAGE-AID Screening:    Have You Ever Felt You Ought to Cut Down on Your Drinking or Drug Use?: No Have People Annoyed You By Critizing Your Drinking Or Drug Use?: No Have You Felt Bad Or Guilty About Your Drinking Or Drug Use?: No Have You Ever Had a Drink or Used Drugs First Thing In The Morning to Steady Your Nerves or to Get Rid of a Hangover?: No CAGE-AID Score: 0  Substance Abuse Education Offered: No
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
## 2023-08-02 NOTE — Plan of Care (Signed)
  Problem: Tissue Perfusion: Goal: Ability to maintain intracranial pressure will improve Outcome: Progressing   Problem: Psychosocial: Goal: Ability to verbalize positive feelings about self will improve Outcome: Progressing Goal: Ability to participate in self-care as condition permits will improve Outcome: Progressing Goal: Ability to identify appropriate support needs will improve Outcome: Progressing

## 2023-08-02 NOTE — Plan of Care (Signed)
  Problem: Education: Goal: Knowledge of disease or condition will improve Outcome: Progressing   Problem: Tissue Perfusion: Goal: Ability to maintain intracranial pressure will improve Outcome: Progressing   Problem: Respiratory: Goal: Will regain and/or maintain adequate ventilation Outcome: Progressing   Problem: Psychosocial: Goal: Ability to participate in self-care as condition permits will improve Outcome: Progressing   Problem: Communication: Goal: Ability to communicate needs accurately will improve Outcome: Progressing

## 2023-08-03 LAB — BASIC METABOLIC PANEL
Anion gap: 7 (ref 5–15)
BUN: 9 mg/dL (ref 6–20)
CO2: 18 mmol/L — ABNORMAL LOW (ref 22–32)
Calcium: 8.4 mg/dL — ABNORMAL LOW (ref 8.9–10.3)
Chloride: 115 mmol/L — ABNORMAL HIGH (ref 98–111)
Creatinine, Ser: 1.06 mg/dL — ABNORMAL HIGH (ref 0.44–1.00)
GFR, Estimated: >60 mL/min (ref >60)
Glucose, Bld: 95 mg/dL (ref 70–99)
Potassium: 3.6 mmol/L (ref 3.5–5.1)
Sodium: 140 mmol/L (ref 135–145)

## 2023-08-03 LAB — CBC
HCT: 31.5 % — ABNORMAL LOW (ref 36.0–46.0)
Hemoglobin: 10.4 g/dL — ABNORMAL LOW (ref 12.0–15.0)
MCH: 29.5 pg (ref 26.0–34.0)
MCHC: 33 g/dL (ref 30.0–36.0)
MCV: 89.5 fL (ref 80.0–100.0)
Platelets: 330 10*3/uL (ref 150–400)
RBC: 3.52 MIL/uL — ABNORMAL LOW (ref 3.87–5.11)
RDW: 12.2 % (ref 11.5–15.5)
WBC: 5.2 10*3/uL (ref 4.0–10.5)
nRBC: 0 % (ref 0.0–0.2)

## 2023-08-03 MED ORDER — KETOROLAC TROMETHAMINE 15 MG/ML IJ SOLN
30.0000 mg | Freq: Four times a day (QID) | INTRAMUSCULAR | Status: DC
Start: 2023-08-03 — End: 2023-08-03

## 2023-08-03 MED ORDER — LEVETIRACETAM 750 MG PO TABS: 1500.0000 mg | ORAL_TABLET | Freq: Every day | ORAL | Status: DC

## 2023-08-03 MED ORDER — SODIUM CHLORIDE 0.9 % IV BOLUS
1000.0000 mL | Freq: Once | INTRAVENOUS | Status: AC
  Administered 2023-08-03: 1000 mL via INTRAVENOUS

## 2023-08-03 MED ORDER — KETOROLAC TROMETHAMINE 15 MG/ML IJ SOLN
15.0000 mg | Freq: Once | INTRAMUSCULAR | Status: AC
  Administered 2023-08-03: 15 mg via INTRAVENOUS
  Filled 2023-08-03: qty 1

## 2023-08-03 MED ORDER — METHOCARBAMOL 500 MG PO TABS
1000.0000 mg | ORAL_TABLET | Freq: Three times a day (TID) | ORAL | Status: DC
  Administered 2023-08-03 – 2023-08-04 (×4): 1000 mg via ORAL
  Filled 2023-08-03 (×4): qty 2

## 2023-08-03 MED ORDER — ENOXAPARIN SODIUM 40 MG/0.4ML IJ SOSY
40.0000 mg | PREFILLED_SYRINGE | Freq: Two times a day (BID) | INTRAMUSCULAR | Status: DC
  Administered 2023-08-03: 40 mg via SUBCUTANEOUS
  Filled 2023-08-03 (×2): qty 0.4

## 2023-08-03 MED ORDER — LEVETIRACETAM 750 MG PO TABS
1500.0000 mg | ORAL_TABLET | Freq: Two times a day (BID) | ORAL | Status: DC
  Administered 2023-08-03 – 2023-08-04 (×2): 1500 mg via ORAL
  Filled 2023-08-03 (×2): qty 2

## 2023-08-03 MED ORDER — DESVENLAFAXINE SUCCINATE ER 100 MG PO TB24
100.0000 mg | ORAL_TABLET | Freq: Every day | ORAL | Status: DC
  Administered 2023-08-03 – 2023-08-04 (×2): 100 mg via ORAL
  Filled 2023-08-03 (×2): qty 1

## 2023-08-03 MED ORDER — TOPIRAMATE 100 MG PO TABS
200.0000 mg | ORAL_TABLET | Freq: Two times a day (BID) | ORAL | Status: DC
  Administered 2023-08-03 – 2023-08-04 (×2): 200 mg via ORAL
  Filled 2023-08-03 (×2): qty 8
  Filled 2023-08-03: qty 2

## 2023-08-03 MED ORDER — ENOXAPARIN SODIUM 30 MG/0.3ML IJ SOSY
30.0000 mg | PREFILLED_SYRINGE | Freq: Two times a day (BID) | INTRAMUSCULAR | Status: DC
Start: 2023-08-03 — End: 2023-08-03
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
## 2023-08-03 NOTE — Progress Notes (Signed)
Patient ID: Crystal Conner, female   DOB: November 04, 1976, 47 y.o.   MRN: [id number] BP 127/87 (BP Location: Left Arm)   Pulse 71   Temp 99.1 F (37.3 C) (Oral)   Resp 17   Ht 5\' 7"  (1.702 m)   Wt 120.9 kg   SpO2 100%   BMI 41.75 kg/m  Alert and oriented x 4, speech is clear and fluent Moving all extremities well Discharge tomorrow.  Repeat CT showed a calcified meningioma. No operative intervention needed or indicated. No surrounding edema.

## 2023-08-03 NOTE — Progress Notes (Signed)
OT Cancellation Note  Patient Details Name: Crystal Conner MRN: [id number] DOB: [birth year]   Cancelled Treatment:    Reason Eval/Treat Not Completed: Active bedrest order- will follow and see as able.   Barry Brunner, OT Acute Rehabilitation Services Office [phone]   Chancy Milroy 08/03/2023, 8:35 AM
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
## 2023-08-04 LAB — BASIC METABOLIC PANEL
Anion gap: 8 (ref 5–15)
BUN: 9 mg/dL (ref 6–20)
CO2: 16 mmol/L — ABNORMAL LOW (ref 22–32)
Calcium: 8.2 mg/dL — ABNORMAL LOW (ref 8.9–10.3)
Chloride: 114 mmol/L — ABNORMAL HIGH (ref 98–111)
Creatinine, Ser: 1.03 mg/dL — ABNORMAL HIGH (ref 0.44–1.00)
GFR, Estimated: >60 mL/min (ref >60)
Glucose, Bld: 88 mg/dL (ref 70–99)
Potassium: 3.6 mmol/L (ref 3.5–5.1)
Sodium: 138 mmol/L (ref 135–145)

## 2023-08-04 LAB — CBC
HCT: 31 % — ABNORMAL LOW (ref 36.0–46.0)
Hemoglobin: 10.3 g/dL — ABNORMAL LOW (ref 12.0–15.0)
MCH: 30.4 pg (ref 26.0–34.0)
MCHC: 33.2 g/dL (ref 30.0–36.0)
MCV: 91.4 fL (ref 80.0–100.0)
Platelets: 332 10*3/uL (ref 150–400)
RBC: 3.39 MIL/uL — ABNORMAL LOW (ref 3.87–5.11)
RDW: 12.1 % (ref 11.5–15.5)
WBC: 5.5 10*3/uL (ref 4.0–10.5)
nRBC: 0 % (ref 0.0–0.2)

## 2023-08-04 MED ORDER — KETOROLAC TROMETHAMINE 15 MG/ML IJ SOLN
15.0000 mg | Freq: Four times a day (QID) | INTRAMUSCULAR | Status: DC | PRN
  Administered 2023-08-04: 15 mg via INTRAVENOUS
  Filled 2023-08-04: qty 1

## 2023-08-04 MED ORDER — ACETAMINOPHEN 500 MG PO TABS: 1000.0000 mg | ORAL_TABLET | Freq: Four times a day (QID) | ORAL | Status: AC | PRN

## 2023-08-04 MED ORDER — OXYCODONE HCL 5 MG PO TABS: 5.0000 mg | ORAL_TABLET | Freq: Four times a day (QID) | ORAL | 0 refills | Status: AC | PRN

## 2023-08-04 MED ORDER — METHOCARBAMOL 500 MG PO TABS: 500.0000 mg | ORAL_TABLET | Freq: Four times a day (QID) | ORAL | 0 refills | Status: AC | PRN

## 2023-08-04 MED ORDER — LEVETIRACETAM 750 MG PO TABS: 1500.0000 mg | ORAL_TABLET | Freq: Two times a day (BID) | ORAL | 0 refills | Status: AC

## 2023-08-04 MED ORDER — TOPIRAMATE 200 MG PO TABS: 200.0000 mg | ORAL_TABLET | Freq: Two times a day (BID) | ORAL | 0 refills | Status: AC

## 2023-08-04 NOTE — TOC Transition Note (Signed)
Transition of Care [facility]) - CM/SW Discharge Note   Patient Details  Name: Crystal Conner MRN: [id number] Date of Birth: September 15, 1976  [facility]) CM/SW Contact:  Ronny Bacon, RN Phone Number: 08/04/2023, 8:47 AM   Clinical Narrative:    Patient is being discharged today. Outpatient speech therapy referral (# [id number]) placed for [facility], closest facility to patient home. Information placed on AVS.    Final next level of care: Home w Home Health Services Barriers to Discharge: No Barriers Identified   Patient Goals and CMS Choice      Discharge Placement                         Discharge Plan and Services Additional resources added to the After Visit Summary for                                       Social Determinants of Health (SDOH) Interventions SDOH Screenings   Food Insecurity: No Food Insecurity (03/05/2023)   Received from [facility]  Transportation Needs: No Transportation Needs (03/05/2023)   Received from [facility]  Utilities: Not At Risk (03/05/2023)   Received from [facility]  Financial Resource Strain: Low Risk  (03/05/2023)   Received from [facility]  Physical Activity: Insufficiently Active (12/30/2021)   Received from [facility]  Social Connections: Unknown (04/02/2022)   Received from [facility]  Stress: No Stress Concern Present (12/30/2021)   Received from [facility]  Tobacco Use: Low Risk  (08/01/2023)     Readmission Risk Interventions     No data to display
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
## 2023-08-04 NOTE — Plan of Care (Signed)
  Problem: Education: Goal: Knowledge of the prescribed therapeutic regimen Outcome: Progressing Goal: Knowledge of disease or condition will improve Outcome: Progressing   Problem: Clinical Measurements: Goal: Neurologic status will improve Outcome: Progressing   Problem: Tissue Perfusion: Goal: Ability to maintain intracranial pressure will improve Outcome: Progressing   Problem: Respiratory: Goal: Will regain and/or maintain adequate ventilation Outcome: Progressing   Problem: Skin Integrity: Goal: Risk for impaired skin integrity will decrease Outcome: Progressing Goal: Demonstration of wound healing without infection will improve Outcome: Progressing   Problem: Psychosocial: Goal: Ability to verbalize positive feelings about self will improve Outcome: Progressing Goal: Ability to participate in self-care as condition permits will improve Outcome: Progressing Goal: Ability to identify appropriate support needs will improve Outcome: Progressing   Problem: Health Behavior/Discharge Planning: Goal: Ability to manage health-related needs will improve Outcome: Progressing   Problem: Nutritional: Goal: Risk of aspiration will decrease Outcome: Progressing Goal: Dietary intake will improve Outcome: Progressing   Problem: Communication: Goal: Ability to communicate needs accurately will improve Outcome: Progressing   Problem: Education: Goal: Knowledge of General Education information will improve Description: Including pain rating scale, medication(s)/side effects and non-pharmacologic comfort measures Outcome: Progressing   Problem: Health Behavior/Discharge Planning: Goal: Ability to manage health-related needs will improve Outcome: Progressing   Problem: Clinical Measurements: Goal: Ability to maintain clinical measurements within normal limits will improve Outcome: Progressing Goal: Will remain free from infection Outcome: Progressing Goal: Diagnostic test  results will improve Outcome: Progressing Goal: Respiratory complications will improve Outcome: Progressing Goal: Cardiovascular complication will be avoided Outcome: Progressing   Problem: Activity: Goal: Risk for activity intolerance will decrease Outcome: Progressing   Problem: Nutrition: Goal: Adequate nutrition will be maintained Outcome: Progressing   Problem: Coping: Goal: Level of anxiety will decrease Outcome: Progressing   Problem: Elimination: Goal: Will not experience complications related to bowel motility Outcome: Progressing Goal: Will not experience complications related to urinary retention Outcome: Progressing   Problem: Pain Managment: Goal: General experience of comfort will improve Outcome: Progressing   Problem: Safety: Goal: Ability to remain free from injury will improve Outcome: Progressing   Problem: Skin Integrity: Goal: Risk for impaired skin integrity will decrease Outcome: Progressing
